# Patient Record
Sex: Female | Born: 1975 | Race: Black or African American | Hispanic: No | Smoking: Never smoker
Health system: Southern US, Community
[De-identification: ages and names within clinical notes are randomized; demographics above are authoritative.]

## PROBLEM LIST (undated history)

## (undated) ENCOUNTER — Inpatient Hospital Stay (HOSPITAL_COMMUNITY): Payer: Self-pay

## (undated) DIAGNOSIS — F32A Depression, unspecified: Secondary | ICD-10-CM

## (undated) DIAGNOSIS — I10 Essential (primary) hypertension: Secondary | ICD-10-CM

## (undated) DIAGNOSIS — R112 Nausea with vomiting, unspecified: Secondary | ICD-10-CM

## (undated) DIAGNOSIS — Z8619 Personal history of other infectious and parasitic diseases: Secondary | ICD-10-CM

## (undated) DIAGNOSIS — Z98891 History of uterine scar from previous surgery: Secondary | ICD-10-CM

## (undated) DIAGNOSIS — R87629 Unspecified abnormal cytological findings in specimens from vagina: Secondary | ICD-10-CM

## (undated) DIAGNOSIS — R51 Headache: Secondary | ICD-10-CM

## (undated) DIAGNOSIS — D219 Benign neoplasm of connective and other soft tissue, unspecified: Secondary | ICD-10-CM

## (undated) DIAGNOSIS — F419 Anxiety disorder, unspecified: Secondary | ICD-10-CM

## (undated) DIAGNOSIS — F329 Major depressive disorder, single episode, unspecified: Secondary | ICD-10-CM

## (undated) DIAGNOSIS — Z9889 Other specified postprocedural states: Secondary | ICD-10-CM

## (undated) HISTORY — DX: Personal history of other infectious and parasitic diseases: Z86.19

## (undated) HISTORY — PX: DILATION AND CURETTAGE OF UTERUS: SHX78

## (undated) HISTORY — PX: INDUCED ABORTION: SHX677

---
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
## 2005-05-14 HISTORY — PX: WISDOM TOOTH EXTRACTION: SHX21

## 2013-05-08 ENCOUNTER — Encounter (HOSPITAL_COMMUNITY): Payer: Self-pay | Admitting: *Deleted

## 2013-05-08 ENCOUNTER — Inpatient Hospital Stay (HOSPITAL_COMMUNITY)
Admission: AD | Admit: 2013-05-08 | Discharge: 2013-05-08 | Disposition: A | Discharge status: Home / Self Care | Payer: Medicaid Other | Source: Ambulatory Visit | Attending: Obstetrics & Gynecology | Admitting: Obstetrics & Gynecology

## 2013-05-08 DIAGNOSIS — Z8679 Personal history of other diseases of the circulatory system: Secondary | ICD-10-CM

## 2013-05-08 DIAGNOSIS — I10 Essential (primary) hypertension: Secondary | ICD-10-CM | POA: Insufficient documentation

## 2013-05-08 DIAGNOSIS — F3289 Other specified depressive episodes: Secondary | ICD-10-CM | POA: Insufficient documentation

## 2013-05-08 DIAGNOSIS — F329 Major depressive disorder, single episode, unspecified: Secondary | ICD-10-CM | POA: Insufficient documentation

## 2013-05-08 DIAGNOSIS — Z3201 Encounter for pregnancy test, result positive: Secondary | ICD-10-CM

## 2013-05-08 DIAGNOSIS — F988 Other specified behavioral and emotional disorders with onset usually occurring in childhood and adolescence: Secondary | ICD-10-CM | POA: Insufficient documentation

## 2013-05-08 HISTORY — DX: Essential (primary) hypertension: I10

## 2013-05-08 HISTORY — DX: Anxiety disorder, unspecified: F41.9

## 2013-05-08 MED ORDER — LABETALOL HCL 200 MG PO TABS: 200.0000 mg | ORAL_TABLET | Freq: Two times a day (BID) | ORAL | Status: DC

## 2013-05-08 MED ORDER — PRENATAL VITAMINS 28-0.8 MG PO TABS: 1.0000 | ORAL_TABLET | Freq: Every day | ORAL | Status: DC

## 2013-05-08 NOTE — MAU Provider Note (Signed)
Attestation of Attending Supervision of Advanced Practitioner (CNM/NP): Evaluation and management procedures were performed by the Advanced Practitioner under my supervision and collaboration.  I have reviewed the Advanced Practitioner's note and chart, and I agree with the management and plan.  HARRAWAY-SMITH, Baylei Siebels 4:53 PM

## 2013-05-08 NOTE — MAU Note (Signed)
Patient states she did a home pregnancy test at home on 12-23 that was positive. Patient states she is ono medication for hypertension but it is not safe to use with pregnancy. Her MD told her to come her to get new Rx for blood pressure. Denies having any problems.

## 2013-05-08 NOTE — MAU Provider Note (Signed)
Chief Complaint: Possible Pregnancy   None    SUBJECTIVE HPI: Suzanne Andrade is a 37 y.o. [id number] at [redacted]w[redacted]d LMP who presents to maternity admissions reporting she is on medications that are not safe in pregnancy but when she called her primary care doctor, he would not prescribe medications in pregnancy and told her to come to [facility] or see an OB/Gyn as soon as possible.  She take medications for anxiety/depression, HTN, and attention deficit disorder that she is concerned about.  She reports doing well with her anxiety and ADD currently without medication but she took her metoprolol today because she often has a racing heartbeat and her MD started this medication to control this symptom.  She denies abdominal or back pain, vaginal bleeding, vaginal itching/burning, urinary symptoms, h/a, dizziness, n/v, or fever/chills.    One medication, lamivudine, is listed in the pt chart by error.  She was taking lamictal but is currently doing well without this medication.   Past Medical History  Diagnosis Date  . Hypertension   . Anxiety    Past Surgical History  Procedure Laterality Date  . Cesarean section    . Cyst removal hand Right 1992  . Wisdom tooth extraction Bilateral 2007   History   Social History  . Marital Status: Legally Separated    Spouse Name: N/A    Number of Children: N/A  . Years of Education: N/A   Occupational History  . Not on file.   Social History Main Topics  . Smoking status: Never Smoker   . Smokeless tobacco: Never Used  . Alcohol Use: No  . Drug Use: No  . Sexual Activity: Yes    Birth Control/ Protection: None   Other Topics Concern  . Not on file   Social History Narrative  . No narrative on file   No current facility-administered medications on file prior to encounter.   No current outpatient prescriptions on file prior to encounter.   No Known Allergies  ROS: Pertinent items in HPI  OBJECTIVE Blood pressure 122/63, pulse 79,  temperature 99.6 F (37.6 C), temperature source Oral, resp. rate 18, height 5\' 4"  (1.626 m), weight 114.125 kg (251 lb 9.6 oz), last menstrual period 04/02/2013, SpO2 100.00%. Patient Vitals for the past 24 hrs:  BP Temp Temp src Pulse Resp SpO2 Height Weight  05/08/13 1500 122/63 mmHg - - 79 - - - -  05/08/13 1416 138/63 mmHg 99.6 F (37.6 C) Oral 89 18 100 % 5\' 4"  (1.626 m) 114.125 kg (251 lb 9.6 oz)   GENERAL: Well-developed, well-nourished female in no acute distress.  HEENT: Normocephalic HEART: normal rate RESP: normal effort ABDOMEN: Soft, non-tender EXTREMITIES: Nontender, no edema NEURO: Alert and oriented   LAB RESULTS Results for orders placed during the hospital encounter of 05/08/13 (from the past 24 hour(s))  POCT PREGNANCY, URINE     Status: Abnormal   Collection Time    05/08/13  3:00 PM      Result Value Range   Preg Test, Ur POSITIVE (*) NEGATIVE    ASSESSMENT 1. Positive pregnancy test   2. History of high blood pressure     PLAN Consult Dr Erin Fulling Discharge home Stop all current medications, including Metoprolol Start Labetalol 200 mg BID PNV with adequate iron started Message sent to [facility] to begin care in [facility] Return to MAU as needed    Medication List    STOP taking these medications       amphetamine-dextroamphetamine 20 MG  tablet  Commonly known as:  ADDERALL     fluticasone 27.5 MCG/SPRAY nasal spray  Commonly known as:  VERAMYST     lamivudine 100 MG tablet  Commonly known as:  EPIVIR     lisinopril-hydrochlorothiazide 20-25 MG per tablet  Commonly known as:  PRINZIDE,ZESTORETIC     metoprolol tartrate 25 MG tablet  Commonly known as:  LOPRESSOR     multivitamin with minerals Tabs tablet      TAKE these medications       labetalol 200 MG tablet  Commonly known as:  NORMODYNE  Take 1 tablet (200 mg total) by mouth 2 (two) times daily.     Prenatal Vitamins 28-0.8 MG Tabs  Take 1 tablet by mouth daily.          Sharen Counter Certified Nurse-Midwife 05/08/2013  3:12 PM

## 2013-06-15 LAB — OB RESULTS CONSOLE ABO/RH: RH TYPE: NEGATIVE

## 2013-06-15 LAB — OB RESULTS CONSOLE ANTIBODY SCREEN: Antibody Screen: NEGATIVE

## 2013-06-15 LAB — OB RESULTS CONSOLE HEPATITIS B SURFACE ANTIGEN: Hepatitis B Surface Ag: NEGATIVE

## 2013-06-15 LAB — OB RESULTS CONSOLE RUBELLA ANTIBODY, IGM: Rubella: IMMUNE

## 2013-06-15 LAB — OB RESULTS CONSOLE HIV ANTIBODY (ROUTINE TESTING): HIV: NONREACTIVE

## 2013-06-15 LAB — OB RESULTS CONSOLE RPR: RPR: NONREACTIVE

## 2013-06-26 ENCOUNTER — Encounter: Payer: Self-pay | Admitting: Endocrinology

## 2013-06-26 ENCOUNTER — Ambulatory Visit (INDEPENDENT_AMBULATORY_CARE_PROVIDER_SITE_OTHER): Payer: Medicaid Other | Admitting: Endocrinology

## 2013-06-26 ENCOUNTER — Other Ambulatory Visit: Payer: Self-pay | Admitting: Endocrinology

## 2013-06-26 ENCOUNTER — Other Ambulatory Visit: Payer: Self-pay | Admitting: *Deleted

## 2013-06-26 VITALS — BP 114/68 | HR 89 | Temp 98.2°F | Resp 14 | Ht 64.0 in | Wt 269.8 lb

## 2013-06-26 DIAGNOSIS — E039 Hypothyroidism, unspecified: Secondary | ICD-10-CM | POA: Insufficient documentation

## 2013-06-26 DIAGNOSIS — I10 Essential (primary) hypertension: Secondary | ICD-10-CM

## 2013-06-26 DIAGNOSIS — R946 Abnormal results of thyroid function studies: Secondary | ICD-10-CM

## 2013-06-26 DIAGNOSIS — R7989 Other specified abnormal findings of blood chemistry: Secondary | ICD-10-CM

## 2013-06-26 LAB — T4, FREE: Free T4: 1.25 ng/dL (ref 0.80–1.80)

## 2013-06-26 LAB — T3, FREE: T3, Free: 3.5 pg/mL (ref 2.3–4.2)

## 2013-06-26 NOTE — Progress Notes (Signed)
Suzanne Andrade M.D.          [facility]   Suzanne Andrade  Reason for Appointment:  Low TSH, new consultation   History of Present Illness:   Apparently she was told about  3 years ago that she had an abnormal thyroid test, probably done as a routine. She only knows that her TSH was low but no records are available and no treatment was required Since she is now pregnant she had a screening TSH done and this was found to be low Currently she only has some complaints of palpitations occasionally which is not new; however does not complain of any unexpected weight loss, heat intolerance, shakiness or nervousness. She does occasionally have trouble swallowing and sometimes needs to drink water to help swallow  She is now referred here for further management     No visits with results within 1 Week(s) from this visit. Latest known visit with results is:  Admission on 05/08/2013, Discharged on 05/08/2013  Component Date Value Ref Range Status  . Preg Test, Ur 05/08/2013 POSITIVE* NEGATIVE Final   Comment:                                 THE SENSITIVITY OF THIS                          METHODOLOGY IS >24 mIU/mL      Medication List       This list is accurate as of: 06/26/13  4:30 PM.  Always use your most recent med list.               labetalol 200 MG tablet  Commonly known as:  NORMODYNE  Take 1 tablet (200 mg total) by mouth 2 (two) times daily.     Prenatal Vitamins 28-0.8 MG Tabs  Take 1 tablet by mouth daily.            Past Medical History  Diagnosis Date  . Hypertension   . Anxiety     Past Surgical History  Procedure Laterality Date  . Cesarean section    . Cyst removal hand Right 1992  . Wisdom tooth extraction Bilateral 2007    Family History  Problem Relation Age of Onset  . Mental illness Mother   . Hypertension Mother   . GER disease  Mother   . Hypertension Father   . Diabetes Father   . Mental illness Maternal Aunt   . Mental illness Maternal Uncle   . Diabetes Maternal Grandmother     Social History:  reports that she has never smoked. She has never used smokeless tobacco. She reports that she does not drink alcohol or use illicit drugs.  Allergies: No Known Allergies  Review of Systems:  She has had a history of weight gain over the last 5 years; she gained about 60 pounds over the last year or so  She does complain of feeling tired but this has been going on for 3 years; also has had symptoms of sleepiness, difficulty with memory Was on Adderal for ADHD because of difficulties in concentration; stopped when she was diagnosed to have pregnancy  CARDIOLOGY:  she has a history of high blood pressure and was previously given Zestoretic.She is currently on labetalol    Also previously has had history of palpitations since 2010 treated with metoprolol. Was not told to have any  specific cardiac problem. May have some more symptoms now since she was switched to labetalol   RESPIRATORY:  no dyspnea on exertion.      GASTROENTEROLOGY:  no Change in bowel habits.      ENDOCRINOLOGY:  no history of Diabetes.             Examination:   BP 114/68  Pulse 89  Temp(Src) 98.2 F (36.8 C)  Resp 14  Ht 5\' 4"  (1.626 m)  Wt 269 lb 12.8 oz (122.38 kg)  BMI 46.29 kg/m2  SpO2 99%  LMP 04/02/2013   General Appearance:  well-built and nourished, pleasant, not anxious or hyperkinetic..        Eyes: No excessive prominence, lid lag or stare. No swelling of the eyelids  Neck: The thyroid is minimally enlarged on the right, normal, smooth, non-tender. There is no lymphadenopathy .          Heart: normal S1 and S2, no murmurs .         Lungs: breath sounds are clear bilaterally  Extremities: hands are warm. No ankle edema. Neurological: REFLEXES: at ankles are normal.  no tremor present   Assessment/Plan:  Abnormally low  TSH, not clear this is subclinical hyperthyroidism or an autonomous thyroid She does have nonspecific history of palpitations which is long-standing and probably unrelated to her thyroid On exam she appears euthyroid and does not have significant goiter  She will have free T4 and free T3 level drawn today to assess actual thyroid levels Discussed with the patient that unless she is overtly hyperthyroid she will not need any antithyroid drugs Most likely she does not have hyperthyroidism related to hyperemesis since she has only mild symptoms of nausea    Hypertension: Well controlled  Suzanne Andrade 06/26/2013, 4:30 PM   Addendum: Labs are normal as follows, no treatment needed at this time, followup in 3 months  Orders Only on 06/26/2013  Component Date Value Ref Range Status  . Free T4 06/26/2013 1.25  0.80 - 1.80 ng/dL Final  . T3, Free 06/26/2013 3.5  2.3 - 4.2 pg/mL Final

## 2013-06-27 NOTE — Progress Notes (Signed)
Quick Note:  Please let patient know that the lab result is normal and no further action needed, followup as scheduled ______

## 2013-06-28 DIAGNOSIS — R7989 Other specified abnormal findings of blood chemistry: Secondary | ICD-10-CM | POA: Insufficient documentation

## 2013-06-28 DIAGNOSIS — I10 Essential (primary) hypertension: Secondary | ICD-10-CM | POA: Insufficient documentation

## 2013-11-30 ENCOUNTER — Other Ambulatory Visit (HOSPITAL_COMMUNITY): Payer: Medicaid Other

## 2013-11-30 ENCOUNTER — Inpatient Hospital Stay (HOSPITAL_COMMUNITY)
Admission: AD | Admit: 2013-11-30 | Discharge: 2013-11-30 | Disposition: A | Discharge status: Home / Self Care | Payer: Medicaid Other | Source: Ambulatory Visit | Attending: Obstetrics and Gynecology | Admitting: Obstetrics and Gynecology

## 2013-11-30 ENCOUNTER — Other Ambulatory Visit (HOSPITAL_COMMUNITY): Payer: Self-pay | Admitting: Obstetrics and Gynecology

## 2013-11-30 ENCOUNTER — Encounter (HOSPITAL_COMMUNITY): Payer: Self-pay | Admitting: *Deleted

## 2013-11-30 ENCOUNTER — Inpatient Hospital Stay (HOSPITAL_COMMUNITY): Payer: Medicaid Other

## 2013-11-30 DIAGNOSIS — O368190 Decreased fetal movements, unspecified trimester, not applicable or unspecified: Secondary | ICD-10-CM | POA: Diagnosis not present

## 2013-11-30 DIAGNOSIS — O10019 Pre-existing essential hypertension complicating pregnancy, unspecified trimester: Secondary | ICD-10-CM | POA: Diagnosis not present

## 2013-11-30 DIAGNOSIS — IMO0001 Reserved for inherently not codable concepts without codable children: Secondary | ICD-10-CM | POA: Insufficient documentation

## 2013-11-30 DIAGNOSIS — O368390 Maternal care for abnormalities of the fetal heart rate or rhythm, unspecified trimester, not applicable or unspecified: Secondary | ICD-10-CM | POA: Insufficient documentation

## 2013-11-30 DIAGNOSIS — F341 Dysthymic disorder: Secondary | ICD-10-CM | POA: Insufficient documentation

## 2013-11-30 DIAGNOSIS — O289 Unspecified abnormal findings on antenatal screening of mother: Secondary | ICD-10-CM

## 2013-11-30 DIAGNOSIS — O288 Other abnormal findings on antenatal screening of mother: Secondary | ICD-10-CM

## 2013-11-30 DIAGNOSIS — O99340 Other mental disorders complicating pregnancy, unspecified trimester: Secondary | ICD-10-CM | POA: Diagnosis not present

## 2013-11-30 DIAGNOSIS — O10919 Unspecified pre-existing hypertension complicating pregnancy, unspecified trimester: Secondary | ICD-10-CM

## 2013-11-30 HISTORY — DX: Unspecified abnormal cytological findings in specimens from vagina: R87.629

## 2013-11-30 HISTORY — DX: Benign neoplasm of connective and other soft tissue, unspecified: D21.9

## 2013-11-30 HISTORY — DX: Depression, unspecified: F32.A

## 2013-11-30 HISTORY — DX: Major depressive disorder, single episode, unspecified: F32.9

## 2013-11-30 HISTORY — DX: Headache: R51

## 2013-11-30 NOTE — MAU Provider Note (Signed)
Chief Complaint:  Decreased Fetal Movement   First Provider Initiated Contact with Patient 11/30/13 1322     HPI: Suzanne Andrade is a 38 y.o. [id number] at [redacted]w[redacted]d who presents to maternity admissions for BPP after NRNST in office. Decreased FM.  Denies contractions, leakage of fluid or vaginal bleeding.   Pregnancy Course: In antenatal testing for 2 vessel cord.  Past Medical History: Past Medical History  Diagnosis Date  . Hypertension   . Anxiety   . Headache(784.0)   . Depression   . Fibroid   . Vaginal Pap smear, abnormal     ok since    Past obstetric history: OB History  Gravida Para Term Preterm AB SAB TAB Ectopic Multiple Living  6 4 4  1  1   4     # Outcome Date GA Lbr Len/2nd Weight Sex Delivery Anes PTL Lv  6 CUR           5 TAB 2008          4 TRM 03/07/00    F LTCS EPI  Y     Comments: previa  3 TRM 07/15/97    F LTCS EPI  Y     Comments: breech, low AFI  2 TRM 01/03/95    M SVD None  Y  1 TRM 11/19/92    F SVD None  Y      Past Surgical History: Past Surgical History  Procedure Laterality Date  . Cesarean section    . Cyst removal hand Right 1992  . Wisdom tooth extraction Bilateral 2007  . Induced abortion       Family History: Family History  Problem Relation Age of Onset  . Mental illness Mother   . Hypertension Mother   . GER disease Mother   . Hypertension Father   . Diabetes Father   . Mental illness Maternal Aunt   . Mental illness Maternal Uncle   . Diabetes Maternal Grandmother     Social History: History  Substance Use Topics  . Smoking status: Never Smoker   . Smokeless tobacco: Never Used  . Alcohol Use: No    Allergies: No Known Allergies  Meds:  Prescriptions prior to admission  Medication Sig Dispense Refill  . ferrous sulfate 325 (65 FE) MG tablet Take 325 mg by mouth daily with breakfast.      . labetalol (NORMODYNE) 200 MG tablet Take 1 tablet (200 mg total) by mouth 2 (two) times daily.  60 tablet  2  . Prenatal  Vit-Fe Fumarate-FA (PRENATAL VITAMINS) 28-0.8 MG TABS Take 1 tablet by mouth daily.  30 tablet  5    ROS: Pertinent findings in history of present illness.  Physical Exam  Blood pressure 104/84, pulse 82, temperature 99.1 F (37.3 C), temperature source Oral, resp. rate 20, last menstrual period 04/02/2013. GENERAL: Well-developed, well-nourished female in no acute distress.  HEENT: normocephalic HEART: normal rate RESP: normal effort ABDOMEN: Soft, non-tender, gravid appropriate for gestational age EXTREMITIES: Nontender, no edema NEURO: alert and oriented SPECULUM EXAM: Deferred    FHT:  Baseline 140 , moderate variability, 10x10 accelerations present, no decelerations Contractions: UI   Labs: No results found for this or any previous visit (from the past 24 hour(s)).  Imaging:  BPP 8/8  MAU Course:   Assessment: 1. Non-reactive NST (non-stress test)   2. Two vessel umbilical cord, antepartum, fetus 1   3. Chronic hypertension during pregnancy, antepartum     Plan: Discharge home Labor precautions  and fetal kick counts     Follow-up Information   Follow up with [facility] On 12/03/2013. (As scheduled or , As needed if symptoms worsen)    Contact information:   [address]  [address] [place] [address] [phone]       Follow up with [facility]. (As needed in emergencies)    Contact information:   [address] [id number] [place] [address] [phone]       Medication List         ferrous sulfate 325 (65 FE) MG tablet  Take 325 mg by mouth daily with breakfast.     labetalol 200 MG tablet  Commonly known as:  NORMODYNE  Take 1 tablet (200 mg total) by mouth 2 (two) times daily.     Prenatal Vitamins 28-0.8 MG Tabs  Take 1 tablet by mouth daily.        [place], [place] 11/30/2013 2:03 PM

## 2013-11-30 NOTE — MAU Note (Signed)
Monitoring in office due to 2 vessel cord, some movement, but was not reactive.  No [place] available- sent for BPP.

## 2013-11-30 NOTE — Discharge Instructions (Signed)
Nonstress Test The nonstress test is a procedure that monitors the fetus's heartbeat. The test will monitor the heartbeat when the fetus is at rest and while the fetus is moving. In a healthy fetus, there will be an increase in fetal heart rate when the fetus moves or kicks. The heart rate will decrease at rest. This test helps determine if the fetus is healthy. The test may take up to a half hour. Your caregiver will look at a number of patterns in the heart rate tracing to make sure your baby is thriving. If there is concern, your caregiver may order additional tests or may suggest another course of action. This test is often done in the third trimester and can help determine if an early delivery is needed and safe. Common reasons to have this test are:  You are past your due date.  You have a high-risk pregnancy.  You are feeling less movement than normal.  You have lost a pregnancy in the past.  Your caregiver suspects fetal growth problems.  You have too much or too little amniotic fluid. BEFORE THE PROCEDURE  Eat a meal right before the test or as directed by your caregiver. Food may help stimulate fetal movements.  Use the restroom right before the test. PROCEDURE  Two belts will be placed around your abdomen. These belts have monitors attached to them. One records the fetal heart rate and the other records uterine contractions.  You may be asked to lie down on your side or to stay sitting upright.  You may be given a button to press when you feel movement.  The fetal heartbeat is listened to and watched on a screen. The heartbeat is recorded on a sheet of paper.  If the fetus seems to be sleeping, you may be asked to drink some juice or soda, gently press your abdomen, or make some noise to wake the fetus. AFTER THE PROCEDURE  Your caregiver will discuss the test results with you and make recommendations for the near future. Document Released: 04/20/2002 Document Revised:  08/25/2012 Document Reviewed: 06/03/2012 [facility] Patient Information 2015 [place], [place]. This information is not intended to replace advice given to you by your health care provider. Make sure you discuss any questions you have with your health care provider.  Fetal Biophysical Profile This is a test that measures five different variables of the fetus: Heart rate, breathing movement, total movement of the baby, fetal muscle tone, the amount of amniotic fluid, and the heart rate activity of the fetus. The five variables are measured individually and contribute either a 2 or a 0 to the overall scoring of the test. The measurements are as follows:  Fetal heart rate activity. This is measured and scored in the same way as a non-stress test. The fetal heart rate is considered reactive when there are movement-associated fetal heart rate increases of at least 15 beats per minute above baseline, and 15 seconds in duration over a 20-minute period. A score of 2 is given for reactivity, and a score of 0 indicates that the fetal heart rate is non-reactive.  Fetal breathing movements. This is scored based on fetal breathing movements and indicate fetal well-being. Their absence may indicate a low oxygen level for the fetus. Fetal breathing increases in frequency and uniformity after the 36th week of pregnancy. To earn a score of 2, the fetus must have at least one episode of fetal breathing lasting at least 60 seconds within a 30-minute observation. Absence of  this breathing is scored a 0 on the BPP.  Fetal body movements. Fetal activity is a reflection of brain integrity and function. The presence of at least three episodes of fetal movements within a 30-minute period is given a score of 2. A score of 0 is given with two or less movements in this time period. Fetal activity is highest 1 to 3 hours after the mother has eaten a meal.  Fetal tone. In the uterus, the fetus is normally in a position of flexion. This  means the head is bent down towards the knees. The fetus also stretches, rolls, and moves in the uterus. The arms, legs, trunk, and head may be flexed and extended. A score of 2 is earned when there is at least one episode of active extension with return flexion. A score of 0 is given for slow extension with a return to only partial flexion. Fetal movement not followed by return to flexion, limbs or spine in extension, and an open fetal hand score 0.  Amniotic fluid volume. Amniotic fluid volume has been demonstrated to be a good method of predicting fetal distress. Too little amniotic fluid has been associated with fetal abnormalities, slow uterine growth, and over due pregnancy. A score of 2 is given for this when there is at least one pocket of amniotic fluid that measures 1 cm in a specific area. A score of 0 indicates either that fluid is absent in most areas of the uterine cavity or that the largest pocket of fluid measures less than 1 cm. PREPARATION FOR TEST No preparation or fasting is necessary. NORMAL FINDINGS  A score of 8-10 points (if amniotic fluid volume is adequate).  Possible critical values: Less than 4 may necessitate immediate delivery of fetus. Ranges for normal findings may vary among different laboratories and hospitals. You should always check with your doctor after having lab work or other tests done to discuss the meaning of your test results and whether your values are considered within normal limits. MEANING OF TEST  Your caregiver will go over the test results with you and discuss the importance and meaning of your results, as well as treatment options and the need for additional tests if necessary. OBTAINING THE TEST RESULTS  It is your responsibility to obtain your test results. Ask the lab or department performing the test when and how you will get your results. Document Released: 08/31/2004 Document Revised: 07/23/2011 Document Reviewed: 04/09/2008 [facility] Patient  Information 2015 [place], [place]. This information is not intended to replace advice given to you by your health care provider. Make sure you discuss any questions you have with your health care provider.

## 2014-01-01 ENCOUNTER — Encounter (HOSPITAL_COMMUNITY): Payer: Self-pay

## 2014-01-04 ENCOUNTER — Encounter (HOSPITAL_COMMUNITY): Payer: Self-pay

## 2014-01-04 ENCOUNTER — Encounter (HOSPITAL_COMMUNITY)
Admission: RE | Admit: 2014-01-04 | Discharge: 2014-01-04 | Disposition: A | Discharge status: Home / Self Care | Payer: Medicaid Other | Source: Ambulatory Visit | Attending: Obstetrics and Gynecology | Admitting: Obstetrics and Gynecology

## 2014-01-04 LAB — CBC
HCT: 34.9 % — ABNORMAL LOW (ref 36.0–46.0)
HEMOGLOBIN: 11.8 g/dL — AB (ref 12.0–15.0)
MCH: 25.7 pg — ABNORMAL LOW (ref 26.0–34.0)
MCHC: 33.8 g/dL (ref 30.0–36.0)
MCV: 76 fL — ABNORMAL LOW (ref 78.0–100.0)
Platelets: 197 10*3/uL (ref 150–400)
RBC: 4.59 MIL/uL (ref 3.87–5.11)
RDW: 15.2 % (ref 11.5–15.5)
WBC: 10.3 10*3/uL (ref 4.0–10.5)

## 2014-01-04 LAB — RPR

## 2014-01-04 MED ORDER — DEXTROSE 5 % IV SOLN
3.0000 g | INTRAVENOUS | Status: AC
  Administered 2014-01-05: 3 g via INTRAVENOUS
  Filled 2014-01-04: qty 3000

## 2014-01-04 NOTE — Patient Instructions (Signed)
[place]  01/04/2014   Your procedure is scheduled on:  01/05/14  Enter through the Main Entrance of [facility] at [place] up the phone at the desk and dial [phone].   Call this number if you have problems the morning of surgery: [phone]   Remember:   Do not eat food:After Midnight.  Do not drink clear liquids: After Midnight.  Take these medicines the morning of surgery with A SIP OF WATER: Blood pressure medication   Do not wear jewelry, make-up or nail polish.  Do not wear lotions, powders, or perfumes. You may wear deodorant.  Do not shave 48 hours prior to surgery.  Do not bring valuables to the hospital.  [facility] is not   responsible for any belongings or valuables brought to the hospital.  Contacts, dentures or bridgework may not be worn into surgery.  Leave suitcase in the car. After surgery it may be brought to your room.  For patients admitted to the hospital, checkout time is 11:00 AM the day of              discharge.   Patients discharged the day of surgery will not be allowed to drive             home.  Name and phone number of your driver: NA  Special Instructions:      Please read over the following fact sheets that you were given:   Surgical Site Infection Prevention

## 2014-01-04 NOTE — H&P (Signed)
Suzanne Andrade is a 38 y.o. female [phone] at 39wk with HTN with h/o 2 LTCS.  D/W pt r/b/a of rLTCS - including r/b/a.  Pt voices understanding.  +FM, no LOF, no VB, occ ctx.  BP reasonably controlled with labetalol 200mg  bid.  Fetus with SUA, followed with BW NST and growth scans.  Also unable to obtain enough cells for panorama.  Nl AFP.  Pt is morbidly obese, has h/o hypothyroid - sent to endocrine for decreased TSH in first trimester.    Maternal Medical History:  Contractions: Frequency: irregular.    Fetal activity: Perceived fetal activity is normal.    Prenatal complications: PIH.   Prenatal Complications - Diabetes: none.    OB History   Grav Para Term Preterm Abortions TAB SAB Ect Mult Living   6 4 4  1 1    4     G1 SVD female 7#7,SVD G2 FAVD, female, 8#4 G60 LTCS 6#14 female G35 LTCS 7#8 female G5 TAB G6 present  H/o abn pap, colpo, nl since H/o trich  Past Medical History  Diagnosis Date  . Hypertension   . Anxiety   . Headache(784.0)   . Depression   . Fibroid   . Vaginal Pap smear, abnormal     ok since  ?hypothyroid, allergies, tachy/palp - nl echo, holter  Past Surgical History  Procedure Laterality Date  . Cesarean section    . Cyst removal hand Right 1992  . Wisdom tooth extraction Bilateral 2007  . Induced abortion    . Dilation and curettage of uterus     Family History: family history includes Diabetes in her father and maternal grandmother; GER disease in her mother; Hypertension in her father and mother; Mental illness in her maternal aunt, maternal uncle, and mother. Social History:  reports that she has never smoked. She has never used smokeless tobacco. She reports that she does not drink alcohol or use illicit drugs.in relationship Meds Labetalol, PNV All NKDA   Prenatal Transfer Tool  Maternal Diabetes: No Genetic Screening: Normal Maternal Ultrasounds/Referrals: Abnormal:  Findings:   Other:Single Umbilical Artery Fetal Ultrasounds or  other Referrals:  None Maternal Substance Abuse:  No Significant Maternal Medications:  Meds include: Other: Labetalol 200mg  bid Significant Maternal Lab Results:  Lab values include: Group B Strep positive Other Comments:  None  Review of Systems  Constitutional: Negative.   HENT: Negative.   Eyes: Negative.   Respiratory: Negative.   Cardiovascular: Negative.   Gastrointestinal: Negative.   Genitourinary: Negative.   Musculoskeletal: Positive for back pain.  Skin: Negative.   Neurological: Negative.   Psychiatric/Behavioral: Negative.       Last menstrual period 04/02/2013. Maternal Exam:  Abdomen: Surgical scars: low transverse.   Fundal height is appropriate for gestation, limited by obesity.   Estimated fetal weight is 7-8.5#.   Fetal presentation: vertex  Introitus: Normal vulva. Normal vagina.  Pelvis: questionable for delivery.      Physical Exam  Constitutional: She is oriented to person, place, and time.  obese  HENT:  Head: Normocephalic and atraumatic.  Cardiovascular: Normal rate and regular rhythm.   Respiratory: Effort normal and breath sounds normal. No respiratory distress. She has no wheezes.  GI: Soft. Bowel sounds are normal. She exhibits no distension. There is no tenderness.  Musculoskeletal: Normal range of motion.  Neurological: She is alert and oriented to person, place, and time.  Skin: Skin is warm and dry.  Psychiatric: She has a normal mood and affect. Her behavior  is normal.    Prenatal labs: ABO, Rh: --/--/O NEG (08/24 1119) Antibody: POS (08/24 1119) Rubella: Immune (02/02 1130) RPR: NON REAC (08/24 1119)  HBsAg: Negative (02/02 1130)  HIV: Non-reactive (02/02 1130)  GBS:   positive  Hgb 11.0/Ur Cx neg/ GC neg/ Chl neg/ Pap WNL, HR HPV positive/ CF neg/AFP WNL/ glucola 80 TSH .141 - refer to endocrine/  [place] good growth, female,nl anat - SUA - 2VC, cwd First Tri [place] cwd Good growth, nl BW NST  Assessment/Plan: 38yo [id number] at 38  for rLTCS Ancef for prophylaxis D/w pt r/b/a of rLTCS   Bovard-Stuckert, Suzanne Andrade 01/04/2014, 9:00 PM

## 2014-01-05 ENCOUNTER — Inpatient Hospital Stay (HOSPITAL_COMMUNITY): Payer: Medicaid Other | Admitting: Anesthesiology

## 2014-01-05 ENCOUNTER — Inpatient Hospital Stay (HOSPITAL_COMMUNITY)
Admission: AD | Admit: 2014-01-05 | Discharge: 2014-01-07 | DRG: 765 | Disposition: A | Discharge status: Home / Self Care | Payer: Medicaid Other | Source: Ambulatory Visit | Attending: Obstetrics and Gynecology | Admitting: Obstetrics and Gynecology

## 2014-01-05 ENCOUNTER — Encounter (HOSPITAL_COMMUNITY)
Admission: AD | Disposition: A | Discharge status: Home / Self Care | Payer: Self-pay | Source: Ambulatory Visit | Attending: Obstetrics and Gynecology

## 2014-01-05 ENCOUNTER — Encounter (HOSPITAL_COMMUNITY): Payer: Self-pay | Admitting: *Deleted

## 2014-01-05 ENCOUNTER — Encounter (HOSPITAL_COMMUNITY): Payer: Medicaid Other | Admitting: Anesthesiology

## 2014-01-05 DIAGNOSIS — F341 Dysthymic disorder: Secondary | ICD-10-CM | POA: Diagnosis present

## 2014-01-05 DIAGNOSIS — Z6841 Body Mass Index (BMI) 40.0 and over, adult: Secondary | ICD-10-CM | POA: Diagnosis not present

## 2014-01-05 DIAGNOSIS — O1002 Pre-existing essential hypertension complicating childbirth: Secondary | ICD-10-CM | POA: Diagnosis present

## 2014-01-05 DIAGNOSIS — Z22330 Carrier of Group B streptococcus: Secondary | ICD-10-CM | POA: Diagnosis not present

## 2014-01-05 DIAGNOSIS — O99284 Endocrine, nutritional and metabolic diseases complicating childbirth: Secondary | ICD-10-CM

## 2014-01-05 DIAGNOSIS — E039 Hypothyroidism, unspecified: Secondary | ICD-10-CM | POA: Diagnosis present

## 2014-01-05 DIAGNOSIS — O99892 Other specified diseases and conditions complicating childbirth: Secondary | ICD-10-CM | POA: Diagnosis present

## 2014-01-05 DIAGNOSIS — E079 Disorder of thyroid, unspecified: Secondary | ICD-10-CM | POA: Diagnosis present

## 2014-01-05 DIAGNOSIS — E669 Obesity, unspecified: Secondary | ICD-10-CM | POA: Diagnosis present

## 2014-01-05 DIAGNOSIS — O09529 Supervision of elderly multigravida, unspecified trimester: Secondary | ICD-10-CM | POA: Diagnosis present

## 2014-01-05 DIAGNOSIS — O99344 Other mental disorders complicating childbirth: Secondary | ICD-10-CM | POA: Diagnosis present

## 2014-01-05 DIAGNOSIS — O34219 Maternal care for unspecified type scar from previous cesarean delivery: Principal | ICD-10-CM | POA: Diagnosis present

## 2014-01-05 DIAGNOSIS — O99214 Obesity complicating childbirth: Secondary | ICD-10-CM

## 2014-01-05 DIAGNOSIS — Z98891 History of uterine scar from previous surgery: Secondary | ICD-10-CM

## 2014-01-05 DIAGNOSIS — O9989 Other specified diseases and conditions complicating pregnancy, childbirth and the puerperium: Secondary | ICD-10-CM

## 2014-01-05 HISTORY — DX: History of uterine scar from previous surgery: Z98.891

## 2014-01-05 SURGERY — Surgical Case
Anesthesia: Epidural | Site: Abdomen

## 2014-01-05 MED ORDER — ONDANSETRON HCL 4 MG/2ML IJ SOLN
INTRAMUSCULAR | Status: DC | PRN
  Administered 2014-01-05: 4 mg via INTRAVENOUS

## 2014-01-05 MED ORDER — ONDANSETRON HCL 4 MG/2ML IJ SOLN: 4.0000 mg | Freq: Three times a day (TID) | INTRAMUSCULAR | Status: DC | PRN

## 2014-01-05 MED ORDER — OXYTOCIN 10 UNIT/ML IJ SOLN
40.0000 [IU] | INTRAVENOUS | Status: DC | PRN
  Administered 2014-01-05: 40 [IU] via INTRAVENOUS

## 2014-01-05 MED ORDER — PROMETHAZINE HCL 25 MG/ML IJ SOLN
6.2500 mg | INTRAMUSCULAR | Status: DC | PRN
  Administered 2014-01-05: 6.25 mg via INTRAVENOUS

## 2014-01-05 MED ORDER — SIMETHICONE 80 MG PO CHEW
80.0000 mg | CHEWABLE_TABLET | Freq: Three times a day (TID) | ORAL | Status: DC
  Administered 2014-01-05 – 2014-01-07 (×6): 80 mg via ORAL
  Filled 2014-01-05 (×5): qty 1

## 2014-01-05 MED ORDER — ONDANSETRON HCL 4 MG/2ML IJ SOLN
INTRAMUSCULAR | Status: AC
  Filled 2014-01-05: qty 2

## 2014-01-05 MED ORDER — SIMETHICONE 80 MG PO CHEW
80.0000 mg | CHEWABLE_TABLET | ORAL | Status: DC
  Administered 2014-01-05: 80 mg via ORAL
  Filled 2014-01-05 (×2): qty 1

## 2014-01-05 MED ORDER — BUPIVACAINE IN DEXTROSE 0.75-8.25 % IT SOLN
INTRATHECAL | Status: DC | PRN
  Administered 2014-01-05: 1.4 mL via INTRATHECAL

## 2014-01-05 MED ORDER — FENTANYL CITRATE 0.05 MG/ML IJ SOLN
INTRAMUSCULAR | Status: AC
  Filled 2014-01-05: qty 2

## 2014-01-05 MED ORDER — LACTATED RINGERS IV SOLN
Freq: Once | INTRAVENOUS | Status: AC
  Administered 2014-01-05: 07:00:00 via INTRAVENOUS

## 2014-01-05 MED ORDER — IBUPROFEN 600 MG PO TABS: 600.0000 mg | ORAL_TABLET | Freq: Four times a day (QID) | ORAL | Status: DC | PRN

## 2014-01-05 MED ORDER — FENTANYL CITRATE 0.05 MG/ML IJ SOLN
INTRAMUSCULAR | Status: DC | PRN
  Administered 2014-01-05: 15 ug via INTRATHECAL

## 2014-01-05 MED ORDER — NALOXONE HCL 0.4 MG/ML IJ SOLN: 0.4000 mg | INTRAMUSCULAR | Status: DC | PRN

## 2014-01-05 MED ORDER — PROMETHAZINE HCL 25 MG/ML IJ SOLN
INTRAMUSCULAR | Status: AC
  Filled 2014-01-05: qty 1

## 2014-01-05 MED ORDER — HYDROMORPHONE HCL PF 1 MG/ML IJ SOLN
0.2500 mg | INTRAMUSCULAR | Status: DC | PRN
  Administered 2014-01-05: 0.25 mg via INTRAVENOUS

## 2014-01-05 MED ORDER — SIMETHICONE 80 MG PO CHEW
80.0000 mg | CHEWABLE_TABLET | ORAL | Status: DC | PRN
  Administered 2014-01-06: 80 mg via ORAL
  Filled 2014-01-05: qty 1

## 2014-01-05 MED ORDER — WITCH HAZEL-GLYCERIN EX PADS: 1.0000 "application " | MEDICATED_PAD | CUTANEOUS | Status: DC | PRN

## 2014-01-05 MED ORDER — MORPHINE SULFATE 0.5 MG/ML IJ SOLN
INTRAMUSCULAR | Status: AC
  Filled 2014-01-05: qty 10

## 2014-01-05 MED ORDER — OXYTOCIN 40 UNITS IN LACTATED RINGERS INFUSION - SIMPLE MED: 62.5000 mL/h | INTRAVENOUS | Status: AC

## 2014-01-05 MED ORDER — OXYTOCIN 10 UNIT/ML IJ SOLN
INTRAMUSCULAR | Status: AC
  Filled 2014-01-05: qty 4

## 2014-01-05 MED ORDER — MENTHOL 3 MG MT LOZG: 1.0000 | LOZENGE | OROMUCOSAL | Status: DC | PRN

## 2014-01-05 MED ORDER — DIPHENHYDRAMINE HCL 50 MG/ML IJ SOLN: 25.0000 mg | INTRAMUSCULAR | Status: DC | PRN

## 2014-01-05 MED ORDER — OXYCODONE-ACETAMINOPHEN 5-325 MG PO TABS
1.0000 | ORAL_TABLET | ORAL | Status: DC | PRN
  Administered 2014-01-06 – 2014-01-07 (×5): 1 via ORAL
  Filled 2014-01-05 (×5): qty 1

## 2014-01-05 MED ORDER — DIPHENHYDRAMINE HCL 25 MG PO CAPS
25.0000 mg | ORAL_CAPSULE | ORAL | Status: DC | PRN
Start: 2014-01-05 — End: 2014-01-07
  Filled 2014-01-05: qty 1

## 2014-01-05 MED ORDER — KETOROLAC TROMETHAMINE 30 MG/ML IJ SOLN: 15.0000 mg | Freq: Once | INTRAMUSCULAR | Status: DC | PRN

## 2014-01-05 MED ORDER — SODIUM CHLORIDE 0.9 % IJ SOLN: 3.0000 mL | INTRAMUSCULAR | Status: DC | PRN

## 2014-01-05 MED ORDER — HYDROMORPHONE HCL PF 1 MG/ML IJ SOLN
INTRAMUSCULAR | Status: AC
  Filled 2014-01-05: qty 1

## 2014-01-05 MED ORDER — ZOLPIDEM TARTRATE 5 MG PO TABS
5.0000 mg | ORAL_TABLET | Freq: Every evening | ORAL | Status: DC | PRN
Start: 2014-01-05 — End: 2014-01-07

## 2014-01-05 MED ORDER — MEPERIDINE HCL 25 MG/ML IJ SOLN: 6.2500 mg | INTRAMUSCULAR | Status: DC | PRN

## 2014-01-05 MED ORDER — SENNOSIDES-DOCUSATE SODIUM 8.6-50 MG PO TABS
2.0000 | ORAL_TABLET | ORAL | Status: DC
  Administered 2014-01-05 – 2014-01-06 (×2): 2 via ORAL
  Filled 2014-01-05 (×2): qty 2

## 2014-01-05 MED ORDER — DEXAMETHASONE SODIUM PHOSPHATE 10 MG/ML IJ SOLN
INTRAMUSCULAR | Status: DC | PRN
  Administered 2014-01-05: 5 mg via INTRAVENOUS

## 2014-01-05 MED ORDER — PRENATAL MULTIVITAMIN CH
1.0000 | ORAL_TABLET | Freq: Every day | ORAL | Status: DC
  Administered 2014-01-06: 1 via ORAL
  Filled 2014-01-05: qty 1

## 2014-01-05 MED ORDER — LACTATED RINGERS IV SOLN
INTRAVENOUS | Status: DC
  Administered 2014-01-05: 16:00:00 via INTRAVENOUS

## 2014-01-05 MED ORDER — SCOPOLAMINE 1 MG/3DAYS TD PT72
1.0000 | MEDICATED_PATCH | Freq: Once | TRANSDERMAL | Status: DC
  Administered 2014-01-05: 1.5 mg via TRANSDERMAL

## 2014-01-05 MED ORDER — METOCLOPRAMIDE HCL 5 MG/ML IJ SOLN
INTRAMUSCULAR | Status: AC
  Filled 2014-01-05: qty 2

## 2014-01-05 MED ORDER — DIPHENHYDRAMINE HCL 50 MG/ML IJ SOLN: 12.5000 mg | INTRAMUSCULAR | Status: DC | PRN

## 2014-01-05 MED ORDER — PHENYLEPHRINE 8 MG IN D5W 100 ML (0.08MG/ML) PREMIX OPTIME
INJECTION | INTRAVENOUS | Status: DC | PRN
  Administered 2014-01-05: 60 ug/min via INTRAVENOUS

## 2014-01-05 MED ORDER — PHENYLEPHRINE 8 MG IN D5W 100 ML (0.08MG/ML) PREMIX OPTIME
INJECTION | INTRAVENOUS | Status: AC
  Filled 2014-01-05: qty 100

## 2014-01-05 MED ORDER — SCOPOLAMINE 1 MG/3DAYS TD PT72: 1.0000 | MEDICATED_PATCH | Freq: Once | TRANSDERMAL | Status: DC

## 2014-01-05 MED ORDER — NALOXONE HCL 1 MG/ML IJ SOLN
1.0000 ug/kg/h | INTRAMUSCULAR | Status: DC | PRN
  Filled 2014-01-05: qty 2

## 2014-01-05 MED ORDER — NALBUPHINE HCL 10 MG/ML IJ SOLN: 5.0000 mg | INTRAMUSCULAR | Status: DC | PRN

## 2014-01-05 MED ORDER — IBUPROFEN 800 MG PO TABS
800.0000 mg | ORAL_TABLET | Freq: Three times a day (TID) | ORAL | Status: DC
  Administered 2014-01-05 – 2014-01-07 (×6): 800 mg via ORAL
  Filled 2014-01-05 (×6): qty 1

## 2014-01-05 MED ORDER — LABETALOL HCL 200 MG PO TABS
200.0000 mg | ORAL_TABLET | Freq: Two times a day (BID) | ORAL | Status: DC
  Administered 2014-01-05 – 2014-01-07 (×4): 200 mg via ORAL
  Filled 2014-01-05 (×4): qty 1

## 2014-01-05 MED ORDER — DEXAMETHASONE SODIUM PHOSPHATE 10 MG/ML IJ SOLN
INTRAMUSCULAR | Status: AC
  Filled 2014-01-05: qty 1

## 2014-01-05 MED ORDER — LANOLIN HYDROUS EX OINT: 1.0000 "application " | TOPICAL_OINTMENT | CUTANEOUS | Status: DC | PRN

## 2014-01-05 MED ORDER — KETOROLAC TROMETHAMINE 30 MG/ML IJ SOLN
INTRAMUSCULAR | Status: AC
  Filled 2014-01-05: qty 1

## 2014-01-05 MED ORDER — SCOPOLAMINE 1 MG/3DAYS TD PT72
MEDICATED_PATCH | TRANSDERMAL | Status: AC
  Administered 2014-01-05: 1.5 mg via TRANSDERMAL
  Filled 2014-01-05: qty 1

## 2014-01-05 MED ORDER — KETOROLAC TROMETHAMINE 30 MG/ML IJ SOLN
30.0000 mg | Freq: Four times a day (QID) | INTRAMUSCULAR | Status: AC | PRN
  Administered 2014-01-05: 30 mg via INTRAVENOUS

## 2014-01-05 MED ORDER — ONDANSETRON HCL 4 MG/2ML IJ SOLN: 4.0000 mg | INTRAMUSCULAR | Status: DC | PRN

## 2014-01-05 MED ORDER — LACTATED RINGERS IV SOLN
INTRAVENOUS | Status: DC
  Administered 2014-01-05 (×2): via INTRAVENOUS

## 2014-01-05 MED ORDER — METOCLOPRAMIDE HCL 5 MG/ML IJ SOLN
10.0000 mg | Freq: Three times a day (TID) | INTRAMUSCULAR | Status: DC | PRN
  Administered 2014-01-05: 10 mg via INTRAVENOUS

## 2014-01-05 MED ORDER — MORPHINE SULFATE (PF) 0.5 MG/ML IJ SOLN
INTRAMUSCULAR | Status: DC | PRN
  Administered 2014-01-05: .1 mg via INTRATHECAL

## 2014-01-05 MED ORDER — KETOROLAC TROMETHAMINE 30 MG/ML IJ SOLN: 30.0000 mg | Freq: Four times a day (QID) | INTRAMUSCULAR | Status: AC | PRN

## 2014-01-05 MED ORDER — DIPHENHYDRAMINE HCL 25 MG PO CAPS: 25.0000 mg | ORAL_CAPSULE | Freq: Four times a day (QID) | ORAL | Status: DC | PRN

## 2014-01-05 MED ORDER — DIBUCAINE 1 % RE OINT: 1.0000 "application " | TOPICAL_OINTMENT | RECTAL | Status: DC | PRN

## 2014-01-05 MED ORDER — ONDANSETRON HCL 4 MG PO TABS
4.0000 mg | ORAL_TABLET | ORAL | Status: DC | PRN
Start: 2014-01-05 — End: 2014-01-07

## 2014-01-05 SURGICAL SUPPLY — 36 items
BLADE SURG 10 STRL SS (BLADE) ×6 IMPLANT
CLAMP CORD UMBIL (MISCELLANEOUS) IMPLANT
CLOTH BEACON ORANGE TIMEOUT ST (SAFETY) ×3 IMPLANT
CONTAINER PREFILL 10% NBF 15ML (MISCELLANEOUS) IMPLANT
DRAPE LG THREE QUARTER DISP (DRAPES) ×3 IMPLANT
DRSG OPSITE POSTOP 4X10 (GAUZE/BANDAGES/DRESSINGS) ×3 IMPLANT
DURAPREP 26ML APPLICATOR (WOUND CARE) ×3 IMPLANT
ELECT REM PT RETURN 9FT ADLT (ELECTROSURGICAL) ×3
ELECTRODE REM PT RTRN 9FT ADLT (ELECTROSURGICAL) ×1 IMPLANT
EXTRACTOR VACUUM M CUP 4 TUBE (SUCTIONS) IMPLANT
EXTRACTOR VACUUM M CUP 4' TUBE (SUCTIONS)
GLOVE BIO SURGEON STRL SZ 6.5 (GLOVE) ×2 IMPLANT
GLOVE BIO SURGEONS STRL SZ 6.5 (GLOVE) ×1
GOWN STRL REUS W/TWL LRG LVL3 (GOWN DISPOSABLE) ×6 IMPLANT
KIT ABG SYR 3ML LUER SLIP (SYRINGE) IMPLANT
NEEDLE HYPO 25X5/8 SAFETYGLIDE (NEEDLE) IMPLANT
NS IRRIG 1000ML POUR BTL (IV SOLUTION) ×3 IMPLANT
PACK C SECTION WH (CUSTOM PROCEDURE TRAY) ×3 IMPLANT
PAD OB MATERNITY 4.3X12.25 (PERSONAL CARE ITEMS) ×3 IMPLANT
RTRCTR C-SECT PINK 25CM LRG (MISCELLANEOUS) ×3 IMPLANT
STAPLER VISISTAT 35W (STAPLE) IMPLANT
SUT MNCRL 0 VIOLET CTX 36 (SUTURE) ×2 IMPLANT
SUT MONOCRYL 0 CTX 36 (SUTURE) ×4
SUT PLAIN 1 NONE 54 (SUTURE) IMPLANT
SUT PLAIN 2 0 XLH (SUTURE) ×3 IMPLANT
SUT VIC AB 0 CT1 27 (SUTURE) ×4
SUT VIC AB 0 CT1 27XBRD ANBCTR (SUTURE) ×2 IMPLANT
SUT VIC AB 2-0 CT1 27 (SUTURE) ×2
SUT VIC AB 2-0 CT1 TAPERPNT 27 (SUTURE) ×1 IMPLANT
SUT VIC AB 3-0 SH 27 (SUTURE) ×2
SUT VIC AB 3-0 SH 27X BRD (SUTURE) ×1 IMPLANT
SUT VIC AB 4-0 KS 27 (SUTURE) ×3 IMPLANT
SYR BULB IRRIGATION 50ML (SYRINGE) ×3 IMPLANT
TOWEL OR 17X24 6PK STRL BLUE (TOWEL DISPOSABLE) ×3 IMPLANT
TRAY FOLEY CATH 14FR (SET/KITS/TRAYS/PACK) ×3 IMPLANT
WATER STERILE IRR 1000ML POUR (IV SOLUTION) ×3 IMPLANT

## 2014-01-05 NOTE — Op Note (Signed)
Suzanne Andrade, Suzanne Andrade              ACCOUNT NO.:  [id number]  MEDICAL RECORD NO.:  [id number]  LOCATION:  [facility]                          FACILITY:  [facility]  PHYSICIAN:  Thornell Sartorius, MD        DATE OF BIRTH:  1975-06-22  DATE OF PROCEDURE:  01/05/2014 DATE OF DISCHARGE:                              OPERATIVE REPORT   PROCEDURE:  Repeat low-transverse cesarean section.  SURGEON:  Thornell Sartorius, MD  ASSISTANT:  Paula Compton, MD  ANESTHESIA:  Combined spinal epidural.  EBL:  Approximately 600 mL.  URINE OUTPUT:  200 mL.  IV FLUIDS:  2500 mL.  COMPLICATION:  None.  PATHOLOGY:  Placenta to L and D.  DESCRIPTION OF PROCEDURE:  After informed consent was reviewed with the patient and her partner, she was transported to the OR, where combined spinal and epidural anesthesia was placed and found to be adequate.  She was then placed in the supine position with a leftward tilt, prepped and draped in a normal sterile fashion.  As the level of her spinal was appropriate, her skin was incised in a Pfannenstiel fashion at the level of her previous incision and carried through to the underlying layer of fascia sharply.  Fascia was incised in the midline.  The incision was extended laterally with Mayo scissors.  Superior aspect of the fascial incision was grasped with Kocher clamps elevating the rectus muscles were dissected off both bluntly and sharply.  In the process, the midline was noted and entered somewhat bluntly.  There was noted to be omental adhesions at the level of the peritoneum as well as anterior uterine wall adhesions.  These were lysed using Bovie cautery.  An Alexis skin retractor was placed carefully checking to make sure no bowel was entrapped.  The uterus was incised in transverse fashion.  The infant was delivered from a vertex presentation.  Nose and mouth were suctioned on the field.  Cord was clamped and cut.  Infant was handed off to the awaiting pediatric staff.   Placenta was expressed.  Uterus was cleared of all clot and debris.  The uterine incision was closed in 2 layers with 0 Monocryl the first of which was a running locked and the second was an imbricating layer.  This was noted to be hemostatic except for 2 areas that were closed with the remaining 5-0 figure-of-eights, 3- 0 Vicryl, and Bovie cautery.  The adnexa were inspected and found to be within normal limits.  Normal tubes and ovaries bilaterally.  The Alexis retractor was removed.  Peritoneum was reapproximated with the rectus muscles with 2-0 Vicryl in a running fashion.  Subfascial planes were inspected and found to be hemostatic.  The fascia was closed from either angle overlapping in the midline with 0 Vicryl.  The subcuticular adipose tissue was made hemostatic with Bovie cautery.  The dead space was closed with 3-0 plain gut.  The skin was closed with 4-0 Vicryl on a Keith needle with Benzoin and Steri-Strips.  The patient tolerated the procedure well.  Sponge, lap, and needle count was correct x2 per the operating room staff.     Thornell Sartorius, MD     JB/MEDQ  D:  01/05/2014  T:  01/05/2014  Job:  [id number]

## 2014-01-05 NOTE — Transfer of Care (Signed)
Immediate Anesthesia Transfer of Care Note  Patient: Suzanne Andrade  Procedure(s) Performed: Procedure(s): REPEAT CESAREAN SECTION (N/A)  Patient Location: PACU  Anesthesia Type:Spinal  Level of Consciousness: awake, alert  and oriented  Airway & Oxygen Therapy: Patient Spontanous Breathing  Post-op Assessment: Report given to PACU RN and Post -op Vital signs reviewed and stable  Post vital signs: Reviewed and stable  Complications: No apparent anesthesia complications

## 2014-01-05 NOTE — Brief Op Note (Signed)
01/05/2014  8:46 AM  PATIENT:  Ok Edwards Noyola  38 y.o. female  PRE-OPERATIVE DIAGNOSIS:  Repeat C/Section, [phone]  POST-OPERATIVE DIAGNOSIS:  same  PROCEDURE:  Procedure(s): REPEAT CESAREAN SECTION (N/A)  SURGEON:  Surgeon(s) and Role:    * Janyth Contes, MD - Primary    * Logan Bores, MD - Assisting   ANESTHESIA:   epidural and spinal  EBL:  Total I/O In: 2500 [I.V.:2500] Out: 900 [Urine:200; Other:100; Blood:600]  BLOOD ADMINISTERED:none  DRAINS: Urinary Catheter (Foley)   LOCAL MEDICATIONS USED:  NONE  SPECIMEN:  Source of Specimen:  Placenta  DISPOSITION OF SPECIMEN:  L&D  COUNTS:  YES  TOURNIQUET:  * No tourniquets in log *  DICTATION: .Other Dictation: Dictation Number [id number]  PLAN OF CARE: Admit to inpatient   PATIENT DISPOSITION:  PACU - hemodynamically stable.   Delay start of Pharmacological VTE agent (>24hrs) due to surgical blood loss or risk of bleeding: not applicable

## 2014-01-05 NOTE — Anesthesia Preprocedure Evaluation (Signed)
Anesthesia Evaluation  Patient identified by MRN, date of birth, ID band Patient awake    Reviewed: Allergy & Precautions, H&P , NPO status , Patient's Chart, lab work & pertinent test results  Airway Mallampati: II TM Distance: >3 FB Neck ROM: full    Dental no notable dental hx.    Pulmonary neg pulmonary ROS,    Pulmonary exam normal       Cardiovascular hypertension, Pt. on home beta blockers     Neuro/Psych PSYCHIATRIC DISORDERS Anxiety Depression    GI/Hepatic negative GI ROS, Neg liver ROS,   Endo/Other  Morbid obesity  Renal/GU negative Renal ROS     Musculoskeletal   Abdominal   Peds  Hematology negative hematology ROS (+)   Anesthesia Other Findings   Reproductive/Obstetrics (+) Pregnancy                           Anesthesia Physical Anesthesia Plan  ASA: III  Anesthesia Plan: Spinal, Epidural and Combined Spinal and Epidural   Post-op Pain Management:    Induction:   Airway Management Planned:   Additional Equipment:   Intra-op Plan:   Post-operative Plan:   Informed Consent: I have reviewed the patients History and Physical, chart, labs and discussed the procedure including the risks, benefits and alternatives for the proposed anesthesia with the patient or authorized representative who has indicated his/her understanding and acceptance.     Plan Discussed with: CRNA, Anesthesiologist and Surgeon  Anesthesia Plan Comments:         Anesthesia Quick Evaluation

## 2014-01-05 NOTE — Interval H&P Note (Signed)
History and Physical Interval Note:  01/05/2014 7:10 AM  Ok Edwards Diss  has presented today for surgery, with the diagnosis of Repeat C/Section, [phone]  The various methods of treatment have been discussed with the patient and family. After consideration of risks, benefits and other options for treatment, the patient has consented to  Procedure(s): REPEAT CESAREAN SECTION (N/A) as a surgical intervention .  The patient's history has been reviewed, patient examined, no change in status, stable for surgery.  I have reviewed the patient's chart and labs.  Questions were answered to the patient's satisfaction.     Bovard-Stuckert, Harlan Ervine

## 2014-01-05 NOTE — Anesthesia Procedure Notes (Signed)
Spinal  Patient location during procedure: OR Start time: 01/05/2014 7:41 AM Staffing Anesthesiologist: CASSIDY, AMY Performed by: anesthesiologist  Preanesthetic Checklist Completed: patient identified, site marked, surgical consent, pre-op evaluation, timeout performed, IV checked, risks and benefits discussed and monitors and equipment checked Spinal Block Patient position: sitting Prep: site prepped and draped and DuraPrep Patient monitoring: cardiac monitor, continuous pulse ox, blood pressure and heart rate Approach: midline Location: L3-4 Injection technique: catheter Needle Needle type: Tuohy and Pencan  Needle gauge: 24 G Needle length: 12.7 cm Needle insertion depth: 7.5 cm Catheter type: closed end flexible Catheter size: 19 g Catheter at skin depth: 14 cm Assessment Sensory level: T4 Additional Notes CSE for BMI 49 and repeat C/S #3.  LOR to air at 7.5 cm, pencan passed with immediate return of clear free flow CSF on first attempt.  SAB dose given.  Pencan removed, tuohy flushed with 2 ml saline.  Epidural catheter placed.  Withdrawn to 12.5 cm at skin.  Pt then positioned in RLD, catheter retracted in to sub-q at 14 cm at skin.  Secured in this position. Patient tolerated procedure well with no apparent complications.  EPIDURAL CATHETER NOT TESTED.  Charlton Haws, MD

## 2014-01-05 NOTE — Anesthesia Postprocedure Evaluation (Signed)
  Anesthesia Post-op Note  Anesthesia Post Note  Patient: Suzanne Andrade  Procedure(s) Performed: Procedure(s) (LRB): REPEAT CESAREAN SECTION (N/A)  Anesthesia type: Spinal/Epidural  Patient location: PACU  Post pain: Pain level controlled  Post assessment: Post-op Vital signs reviewed  Post vital signs: Reviewed  Level of consciousness: awake  Complications: No apparent anesthesia complications

## 2014-01-06 ENCOUNTER — Encounter (HOSPITAL_COMMUNITY): Payer: Self-pay | Admitting: Obstetrics and Gynecology

## 2014-01-06 LAB — CBC
HCT: 33 % — ABNORMAL LOW (ref 36.0–46.0)
Hemoglobin: 11.2 g/dL — ABNORMAL LOW (ref 12.0–15.0)
MCH: 25.7 pg — ABNORMAL LOW (ref 26.0–34.0)
MCHC: 33.9 g/dL (ref 30.0–36.0)
MCV: 75.9 fL — ABNORMAL LOW (ref 78.0–100.0)
PLATELETS: 166 10*3/uL (ref 150–400)
RBC: 4.35 MIL/uL (ref 3.87–5.11)
RDW: 15.2 % (ref 11.5–15.5)
WBC: 13.3 10*3/uL — AB (ref 4.0–10.5)

## 2014-01-06 LAB — BIRTH TISSUE RECOVERY COLLECTION (PLACENTA DONATION)

## 2014-01-06 MED ORDER — RHO D IMMUNE GLOBULIN 1500 UNIT/2ML IJ SOSY
300.0000 ug | PREFILLED_SYRINGE | Freq: Once | INTRAMUSCULAR | Status: AC
  Administered 2014-01-06: 300 ug via INTRAMUSCULAR
  Filled 2014-01-06: qty 2

## 2014-01-06 NOTE — Progress Notes (Signed)
Subjective: Postpartum Day 1: Cesarean Delivery Patient reports incisional pain and tolerating PO.  Nl lochia, pain controlled.  duramorph wearing off.    Objective: Vital signs in last 24 hours: Temp:  [97.2 F (36.2 C)-98.4 F (36.9 C)] 98 F (36.7 C) (08/26 0230) Pulse Rate:  [55-82] 61 (08/26 0230) Resp:  [13-21] 18 (08/26 0230) BP: (104-149)/(42-86) 143/69 mmHg (08/26 0230) SpO2:  [97 %-100 %] 100 % (08/26 0230) Weight:  [128.368 kg (283 lb)] 128.368 kg (283 lb) (08/25 0700)  Physical Exam:  General: alert and no distress Lochia: appropriate Uterine Fundus: firm Incision: healing well, some blood on bandage DVT Evaluation: No evidence of DVT seen on physical exam.   Recent Labs  01/04/14 1119 01/06/14 0535  HGB 11.8* 11.2*  HCT 34.9* 33.0*    Assessment/Plan: Status post Cesarean section. Doing well postoperatively.  Continue current care.  Circumcision tomorrow, encourage ambulation.    Bovard-Stuckert, Melessia Kaus 01/06/2014, 6:03 AM

## 2014-01-06 NOTE — Progress Notes (Signed)
Ur chart review completed.  

## 2014-01-06 NOTE — Anesthesia Postprocedure Evaluation (Signed)
  Anesthesia Post-op Note  Patient: Suzanne Andrade  Procedure(s) Performed: Procedure(s): REPEAT CESAREAN SECTION (N/A)  Patient Location: Mother/Baby  Anesthesia Type:Spinal and Epidural  Level of Consciousness: awake  Airway and Oxygen Therapy: Patient Spontanous Breathing  Post-op Pain: none  Post-op Assessment: Post-op Vital signs reviewed, Patient's Cardiovascular Status Stable, Respiratory Function Stable, Patent Airway, No signs of Nausea or vomiting, Adequate PO intake, Pain level controlled, No headache, No backache, No residual numbness and No residual motor weakness  Post-op Vital Signs: Reviewed and stable  Last Vitals:  Filed Vitals:   01/06/14 0630  BP: 133/70  Pulse: 71  Temp: 36.8 C  Resp: 18    Complications: No apparent anesthesia complications

## 2014-01-06 NOTE — Addendum Note (Signed)
Addendum created 01/06/14 0848 by Tobin Chad, CRNA   Modules edited: Notes Section   Notes Section:  File: [id number]

## 2014-01-06 NOTE — Progress Notes (Signed)
Clinical Social Work Department PSYCHOSOCIAL ASSESSMENT - MATERNAL/CHILD 01/06/2014  Patient:  Suzanne Andrade,Suzanne Andrade  Account Number:  [id number]  Admit Date:  01/05/2014  Childs Name:   Timothy [name]    Clinical Social Worker:  Shital Crayton, CLINICAL SOCIAL WORKER   Date/Time:  01/06/2014 12:45 PM  Date Referred:  01/05/2014   Referral source  Central Nursery     Referred reason  Depression/Anxiety   Other referral source:    I:  FAMILY / HOME ENVIRONMENT Child's legal guardian:  PARENT  Guardian - Name Guardian - Age Guardian - Address  Suzanne Andrade 38 [address], [place], [place] [address]  Timothy [name]  same as above   Other household support members/support persons Name Relationship DOB   DAUGHTER 21   SON 19   DAUGHTER 16   DAUGHTER 13   MOTHER    Other support:   MOB shared that she has a very close relationship with her mother who lives in their home.  She stated that the FOB has a large family and is very supportive. MOB discussed supportive relationship with the FOB.    II  PSYCHOSOCIAL DATA Information Source:  Patient Interview  Financial and Community Resources Employment:   MOB shared that she does not work outside of the home. Per MOB, the FOB is employed and they are able to meet all basic needs.   Financial resources:  Medicaid If Medicaid - County:  [place] Other  Food Stamps  WIC   School / Grade:  N/A Maternity Care Coordinator / Child Services Coordination / Early Interventions:   N/A  Cultural issues impacting care:   None reported    III  STRENGTHS Strengths  Adequate Resources  Home prepared for Child (including basic supplies)  Supportive family/friends   Strength comment:    IV  RISK FACTORS AND CURRENT PROBLEMS Current Problem:  YES   Risk Factor & Current Problem Patient Issue Family Issue Risk Factor / Current Problem Comment  Mental Illness Y N MOB reports history of depression/bipolar.  She  stated that she is currently receiving outpatient therapy and medication management at the [facility].  She shared that she discontinued her medications while she was pregnant, but stated that her symptoms have been stabile and she intends to re-start medications once she is done breastfeeding (if not earlier).    V  SOCIAL WORK ASSESSMENT CSW met with MOB in order to complete the assessment. Consult ordered due to MOB presenting with a history of anxiety and depression.  MOB willingly engaged with CSW and was willing to discuss the reason for the consult.  MOB presented with full range and appropriate mood to the setting.  MOB presented with self-awareness related to her mental health symptoms and presented with motivation to continue to receive outpatient treatment for mental health symptoms during the postpartum period.   CSW did not note or observe any acute mental health symptoms.   MOB expressed excitement upon the birth of the baby.  She expressed that the FOB is also supportive as he becomes a first time father.  CSW inquired about thoughts and feelings as she readjusts to having a newborn as she has numerous older children, but MOB shared that she was excited to have another baby since she enjoys having young children around.  She smiled as she reflected upon the preparations she engaged in to have the home ready for his arrival, and stated that she believes she is well supported by   her family.   MOB denied history of postpartum depression with previous children.  She was receptive to reviewing signs and symptoms with CSW.  MOB aware of increased risk due to her mental health history.  MOB receptive to discussing her history of anxiety, depression, and bipolar.  Per MOB, she noted increase in depressive symptoms, including being tearful and irritable, when her children grew up. She stated that due to her symptoms, she has been tried on numerous medications including Abilify, Seroquel, and  Lamictal.  Per MOB, medications had numerous side effects and she was never continued on these medications.  She shared that prior to her pregnancy, she was only prescribed Adderall, which she discontinued when she learned that she was pregnant.  MOB shared that she has not had any acute symptoms while pregnant, but she verbalized understanding that this is common for women due to hormonal changes.  MOB shared that she is currently receiving outpatient therapy and medication management at the [facility], most recently seen 3 weeks ago.  MOB aware of importance of scheduling an appointment in upcoming weeks to follow-up to assess symptoms in postpartum period. MOB shared that she would like to breast feed, but discussed that if she needs to be placed on a medication to stabilize her symptoms, that she will take her medication since she is aware that her mental health symptoms may negative impact her ability to be a parent.  MOB able to verbalize symptoms that warrant professional intervention, and is able to discuss early signs that her symptoms are beginning to worsen.   No barriers to discharge.     VI SOCIAL WORK PLAN Social Work Plan  Patient/Family Education  No Further Intervention Required / No Barriers to Discharge   Type of pt/family education:   Postpartum depression   If child protective services report - county:   If child protective services report - date:   Information/referral to community resources comment:   N/A   Other social work plan:   Ongoing emotional support as needed     

## 2014-01-07 LAB — TYPE AND SCREEN
ABO/RH(D): O NEG
Antibody Screen: POSITIVE
DAT, IGG: NEGATIVE
UNIT DIVISION: 0
Unit division: 0

## 2014-01-07 LAB — RH IG WORKUP (INCLUDES ABO/RH)
ABO/RH(D): O NEG
Fetal Screen: NEGATIVE
GESTATIONAL AGE(WKS): 39
Unit division: 0

## 2014-01-07 MED ORDER — PRENATAL VITAMINS 28-0.8 MG PO TABS: 1.0000 | ORAL_TABLET | Freq: Every day | ORAL | Status: DC

## 2014-01-07 MED ORDER — OXYCODONE-ACETAMINOPHEN 5-325 MG PO TABS: 1.0000 | ORAL_TABLET | Freq: Four times a day (QID) | ORAL | Status: DC | PRN

## 2014-01-07 MED ORDER — IBUPROFEN 800 MG PO TABS: 800.0000 mg | ORAL_TABLET | Freq: Three times a day (TID) | ORAL | Status: DC | PRN

## 2014-01-07 NOTE — Lactation Note (Addendum)
This note was copied from the chart of [place]. Lactation Consultation Note  Patient Name: Suzanne Andrade LPNPY'Y Date: 01/07/2014 Reason for consult: Follow-up assessment Per mom I'm not sure if the baby is feeding well , baby woke up fussy ,  And LC assisted mom with latch . Reviewed hand expressing and noted a steady flow of colostrum , baby latched with ease  and with depth, multiply swallows noted , increased with breast compressions and mom seemed amazed. Per mom comfortable  With latch. Reviewed sore nipple and engorgement prevention and tx . Reviewed hand pump and increased flange to #27 due to the base appearing  alittle tight.  Also referred to the Baby and me booklet. Mother informed of post-discharge support and given phone number to the lactation department, including services for phone call assistance; out-patient  appointments; and breastfeeding support group. List of other breastfeeding resources in the community given in the handout. Encouraged mother to call for problems or concerns related to breastfeeding.   Maternal Data    Feeding Feeding Type: Breast Fed  LATCH Score/Interventions Latch: Grasps breast easily, tongue down, lips flanged, rhythmical sucking. Intervention(s): Skin to skin;Teach feeding cues;Waking techniques Intervention(s): Adjust position;Assist with latch;Breast massage;Breast compression  Audible Swallowing: Spontaneous and intermittent  Type of Nipple: Everted at rest and after stimulation  Comfort (Breast/Nipple): Soft / non-tender     Hold (Positioning): Assistance needed to correctly position infant at breast and maintain latch. Intervention(s): Breastfeeding basics reviewed;Support Pillows;Position options;Skin to skin  LATCH Score: 9  Lactation Tools Discussed/Used WIC Program: Yes Pump Review: Setup, frequency, and cleaning;Milk Storage Initiated by:: MAI  Date initiated:: 01/07/14   Consult Status Consult  Status: Complete Date: 01/07/14 Follow-up type: In-patient    Myer Haff 01/07/2014, 10:58 AM

## 2014-01-07 NOTE — Discharge Summary (Signed)
Obstetric Discharge Summary Reason for Admission: cesarean section Prenatal Procedures: none Intrapartum Procedures: cesarean: low cervical, transverse Postpartum Procedures: none Complications-Operative and Postpartum: none Hemoglobin  Date Value Ref Range Status  01/06/2014 11.2* 12.0 - 15.0 g/dL Final     HCT  Date Value Ref Range Status  01/06/2014 33.0* 36.0 - 46.0 % Final    Physical Exam:  General: alert and no distress Lochia: appropriate Uterine Fundus: firm Incision: healing well DVT Evaluation: No evidence of DVT seen on physical exam.  Discharge Diagnoses: Term Pregnancy-delivered  Discharge Information: Date: 01/07/2014 Activity: pelvic rest Diet: routine Medications: PNV, Ibuprofen and Percocet, labetalol Condition: stable Instructions: refer to practice specific booklet Discharge to: home Follow-up Information   Follow up with Bovard-Stuckert, Terrian Ridlon, MD. Schedule an appointment as soon as possible for a visit in 2 weeks. (Incision check, 6 wk postpartum/operative check)    Specialty:  Obstetrics and Gynecology   Contact information:   [address] [address] [phone]       Newborn Data: Live born female  Birth Weight: 7 lb 6.2 oz (3350 g) APGAR: 9, 9  Home with mother.  Bovard-Stuckert, Artemisa Sladek 01/07/2014, 8:01 AM

## 2014-01-07 NOTE — Lactation Note (Addendum)
This note was copied from the chart of [place]. Lactation Consultation Note Mom was attempting to BF baby in cradle position w/baby laying on his back turning head towards the breast. Mom c-section sitting in middle of bed w/no back support. Reposotioned mom in bed to be more comfortable. Encouraged d/t having c-section and abd. Distended and large abd. Making big hump and cradle feeding isn't the ideal feeding position at this time, encouraged football position. Mom stated it was very comfortable. Moms breast is pendulum shaped, mom stated breast hasn't been pointing down like that. Explained d/t fluid from surgery etc. Could have a factor in that reason. Elevated breast w/cloth. Encouraged to use "C" hold of the breast to latch baby. Hand expression demonstrated w/noted good colostrum. Mom encouraged to feed baby 8-12 times/24 hours and with feeding cues. Mom encouraged to waken baby for feeds. Mom reports + breast changes w/pregnancy.  Educated about newborn behavior. [facility] brochure given w/resources, support groups and [facility] services.Referred to Baby and Me Book in Breastfeeding section Pg. 22-23 for position options and Proper latch demonstration. This is moms 5th baby but has never BF before. Patient Name: Suzanne Andrade [id number] Date: 01/07/2014 Reason for consult: Initial assessment   Maternal Data Has patient been taught Hand Expression?: Yes Does the patient have breastfeeding experience prior to this delivery?: No  Feeding Feeding Type: Breast Fed Length of feed: 25 min  LATCH Score/Interventions Latch: Grasps breast easily, tongue down, lips flanged, rhythmical sucking. Intervention(s): Skin to skin;Teach feeding cues;Waking techniques Intervention(s): Adjust position;Assist with latch;Breast massage;Breast compression  Audible Swallowing: A few with stimulation Intervention(s): Skin to skin;Hand expression Intervention(s): Alternate breast massage  Type of Nipple:  Everted at rest and after stimulation  Comfort (Breast/Nipple): Soft / non-tender     Hold (Positioning): Assistance needed to correctly position infant at breast and maintain latch. Intervention(s): Breastfeeding basics reviewed;Support Pillows;Position options;Skin to skin  LATCH Score: 8  Lactation Tools Discussed/Used     Consult Status Consult Status: Follow-up Date: 01/07/14 Follow-up type: In-patient    Theodoro Kalata 01/07/2014, 4:19 AM

## 2014-01-07 NOTE — Progress Notes (Signed)
Subjective: Postpartum Day 2: Cesarean Delivery Patient reports incisional pain and tolerating PO.  Nl lochia, pain controlled  Objective: Vital signs in last 24 hours: Temp:  [98.1 F (36.7 C)-98.3 F (36.8 C)] 98.2 F (36.8 C) (08/27 0701) Pulse Rate:  [62-91] 77 (08/27 0701) Resp:  [18] 18 (08/27 0701) BP: (121-137)/(54-78) 126/61 mmHg (08/27 0701) SpO2:  [100 %] 100 % (08/27 0701)  Physical Exam:  General: alert and no distress Lochia: appropriate Uterine Fundus: firm Incision: healing well DVT Evaluation: No evidence of DVT seen on physical exam.   Recent Labs  01/04/14 1119 01/06/14 0535  HGB 11.8* 11.2*  HCT 34.9* 33.0*    Assessment/Plan: Status post Cesarean section. Doing well postoperatively.  Discharge home with standard precautions and return to clinic in 2 weeks.  D/C with motrin, percocet, and PNV.  Routine instructions, keep incision dry.    Bovard-Stuckert, Lindsie Simar 01/07/2014, 7:48 AM

## 2014-03-15 ENCOUNTER — Encounter (HOSPITAL_COMMUNITY): Payer: Self-pay | Admitting: Obstetrics and Gynecology

## 2014-11-22 ENCOUNTER — Other Ambulatory Visit: Payer: Self-pay | Admitting: Family Medicine

## 2014-11-22 DIAGNOSIS — M79605 Pain in left leg: Secondary | ICD-10-CM

## 2014-11-25 ENCOUNTER — Ambulatory Visit
Admission: RE | Admit: 2014-11-25 | Discharge: 2014-11-25 | Disposition: A | Discharge status: Home / Self Care | Payer: Medicaid Other | Source: Ambulatory Visit | Attending: Family Medicine | Admitting: Family Medicine

## 2014-11-25 DIAGNOSIS — M79605 Pain in left leg: Secondary | ICD-10-CM

## 2015-01-19 LAB — OB RESULTS CONSOLE ABO/RH: RH Type: NEGATIVE

## 2015-01-19 LAB — OB RESULTS CONSOLE HIV ANTIBODY (ROUTINE TESTING): HIV: NONREACTIVE

## 2015-01-19 LAB — OB RESULTS CONSOLE ANTIBODY SCREEN: Antibody Screen: NEGATIVE

## 2015-01-19 LAB — OB RESULTS CONSOLE GC/CHLAMYDIA
Chlamydia: NEGATIVE
GC PROBE AMP, GENITAL: NEGATIVE

## 2015-01-19 LAB — OB RESULTS CONSOLE HEPATITIS B SURFACE ANTIGEN: Hepatitis B Surface Ag: NEGATIVE

## 2015-01-19 LAB — OB RESULTS CONSOLE RPR: RPR: NONREACTIVE

## 2015-01-19 LAB — OB RESULTS CONSOLE RUBELLA ANTIBODY, IGM: Rubella: IMMUNE

## 2015-03-23 ENCOUNTER — Encounter: Payer: Self-pay | Admitting: Cardiovascular Disease

## 2015-03-23 ENCOUNTER — Ambulatory Visit (INDEPENDENT_AMBULATORY_CARE_PROVIDER_SITE_OTHER): Payer: Medicaid Other | Admitting: Cardiovascular Disease

## 2015-03-23 VITALS — BP 134/70 | HR 81 | Ht 64.0 in | Wt 279.1 lb

## 2015-03-23 DIAGNOSIS — I10 Essential (primary) hypertension: Secondary | ICD-10-CM

## 2015-03-23 LAB — CBC
HCT: 33.4 % — ABNORMAL LOW (ref 36.0–46.0)
Hemoglobin: 10.7 g/dL — ABNORMAL LOW (ref 12.0–15.0)
MCH: 25.1 pg — ABNORMAL LOW (ref 26.0–34.0)
MCHC: 32 g/dL (ref 30.0–36.0)
MCV: 78.2 fL (ref 78.0–100.0)
MPV: 9.7 fL (ref 8.6–12.4)
Platelets: 186 10*3/uL (ref 150–400)
RBC: 4.27 MIL/uL (ref 3.87–5.11)
RDW: 15 % (ref 11.5–15.5)
WBC: 8.9 10*3/uL (ref 4.0–10.5)

## 2015-03-23 LAB — D-DIMER, QUANTITATIVE (NOT AT ARMC): D DIMER QUANT: 0.32 ug{FEU}/mL (ref 0.00–0.48)

## 2015-03-23 LAB — T4: T4, Total: 10.8 ug/dL (ref 4.5–12.0)

## 2015-03-23 LAB — T3: T3 TOTAL: 181.4 ng/dL (ref 80.0–204.0)

## 2015-03-23 LAB — TSH: TSH: 0.353 u[IU]/mL (ref 0.350–4.500)

## 2015-03-23 LAB — BRAIN NATRIURETIC PEPTIDE: Brain Natriuretic Peptide: 51.7 pg/mL (ref 0.0–100.0)

## 2015-03-23 NOTE — Patient Instructions (Addendum)
Medication Instructions:  Your physician recommends that you continue on your current medications as directed. Please refer to the Current Medication list given to you today.  Labwork: Your physician recommends that you have lab work today CBC, TSH, D-dimer, BNP, T3 and T4   Testing/Procedures: Your physician has requested that you have an echocardiogram. Echocardiography is a painless test that uses sound waves to create images of your heart. It provides your doctor with information about the size and shape of your heart and how well your heart's chambers and valves are working. This procedure takes approximately one hour. There are no restrictions for this procedure.  Follow-Up: Your physician recommends that you schedule a follow-up appointment as needed.        If you need a refill on your cardiac medications before your next appointment, please call your pharmacy.  Echocardiogram An echocardiogram, or echocardiography, uses sound waves (ultrasound) to produce an image of your heart. The echocardiogram is simple, painless, obtained within a short period of time, and offers valuable information to your health care provider. The images from an echocardiogram can provide information such as:  Evidence of coronary artery disease (CAD).  Heart size.  Heart muscle function.  Heart valve function.  Aneurysm detection.  Evidence of a past heart attack.  Fluid buildup around the heart.  Heart muscle thickening.  Assess heart valve function. LET [facility] CARE PROVIDER KNOW ABOUT:  Any allergies you have.  All medicines you are taking, including vitamins, herbs, eye drops, creams, and over-the-counter medicines.  Previous problems you or members of your family have had with the use of anesthetics.  Any blood disorders you have.  Previous surgeries you have had.  Medical conditions you have.  Possibility of pregnancy, if this applies. BEFORE THE PROCEDURE  No  special preparation is needed. Eat and drink normally.  PROCEDURE   In order to produce an image of your heart, gel will be applied to your chest and a wand-like tool (transducer) will be moved over your chest. The gel will help transmit the sound waves from the transducer. The sound waves will harmlessly bounce off your heart to allow the heart images to be captured in real-time motion. These images will then be recorded.  You may need an IV to receive a medicine that improves the quality of the pictures. AFTER THE PROCEDURE You may return to your normal schedule including diet, activities, and medicines, unless your health care provider tells you otherwise.   This information is not intended to replace advice given to you by your health care provider. Make sure you discuss any questions you have with your health care provider.   Document Released: 04/27/2000 Document Revised: 05/21/2014 Document Reviewed: 01/05/2013 Elsevier Interactive Patient Education [employer].

## 2015-03-23 NOTE — Progress Notes (Signed)
Patient ID: Suzanne Andrade, female   DOB: [birth year], 39 y.o.   MRN: [id number]     Cardiology Office Note   Date:  03/23/2015   ID:  Suzanne Andrade, DOB 07-19-1975, MRN [id number]  PCP:  Suzanne Garbe, MD  Cardiologist:   Suzanne Rouge, MD   No chief complaint on file.     History of Present Illness: Suzanne Andrade is a 39 y.o. female who presents for evaluation of tachycardia.  Referred by Dr Lin Landsman MD [facility]  She is18 weeks pregnant with her 6th child.  Last year had palpitaitons and edema ? Seen by Dr Einar Gip.  No previous cardiac issues.  Non smoker.  Been on BP meds 6-7 years. When this pregnancy identified ACE stopped and placed on labatolol.  Has had atypical chest pain. Positional Not pleuritc worse on left side.  Dyspnea with exertion.  Has gained 15 lbs.  Obese to begin with.  Indicates previous thyroid studies "low" but MD said Suzanne.  No syncope.      Past Medical History  Diagnosis Date  . Hypertension   . Anxiety   . Headache(784.0)   . Depression   . Fibroid   . Vaginal Pap smear, abnormal     Suzanne since  . S/P cesarean section 01/05/2014    Past Surgical History  Procedure Laterality Date  . Cesarean section    . Cyst removal hand Right 1992  . Wisdom tooth extraction Bilateral 2007  . Induced abortion    . Dilation and curettage of uterus    . Cesarean section N/A 01/05/2014    Procedure: REPEAT CESAREAN SECTION;  Surgeon: Janyth Contes, MD;  Location: [facility];  Service: Obstetrics;  Laterality: N/A;     Current Outpatient Prescriptions  Medication Sig Dispense Refill  . ferrous sulfate 325 (65 FE) MG tablet Take 325 mg by mouth daily with breakfast.    . labetalol (NORMODYNE) 200 MG tablet Take 1 tablet (200 mg total) by mouth 2 (two) times daily. 60 tablet 2  . prenatal vitamin w/FE, FA (PRENATAL 1 + 1) 27-1 MG TABS tablet Take 1 tablet by mouth daily.  12   No current facility-administered medications for this visit.     Allergies:   Review of patient's allergies indicates no known allergies.    Social History:  The patient  reports that she has never smoked. She has never used smokeless tobacco. She reports that she does not drink alcohol or use illicit drugs.   Family History:  The patient's family history includes Diabetes in her father and maternal grandmother; GER disease in her mother; Hypertension in her father and mother; Mental illness in her maternal aunt, maternal uncle, and mother.    ROS:  Please see the history of present illness.   Otherwise, review of systems are positive for none.   All other systems are reviewed and negative.    PHYSICAL EXAM: VS:  There were no vitals taken for this visit. , BMI There is no weight on file to calculate BMI. Affect appropriate Overweight pregnant black female HEENT: normal Neck supple with no adenopathy JVP normal no bruits no thyromegaly Lungs clear with no wheezing and good diaphragmatic motion Heart:  S1/S2 SEM  murmur, no rub, gallop or click PMI normal Abdomen: benighn, BS positve, no tenderness, no AAA no bruit.  No HSM or HJR Distal pulses intact with no bruits No edema Neuro non-focal Skin warm and dry No muscular weakness    EKG:  NSR normal ECG 03/23/15     Recent Labs: No results found for requested labs within last 365 days.    Lipid Panel No results found for: CHOL, TRIG, HDL, CHOLHDL, VLDL, LDLCALC, LDLDIRECT    Wt Readings from Last 3 Encounters:  01/05/14 128.368 kg (283 lb)  01/04/14 128.731 kg (283 lb 12.8 oz)  06/26/13 122.38 kg (269 lb 12.8 oz)      Other studies Reviewed: Additional studies/ records that were reviewed today include: Primary care/Ob notes Epic notes/ labs .    ASSESSMENT AND PLAN:  1.  Dyspnea:  Seems functional but more than usual for patient. Likely related to pregnancy , weight gain and obesity.  F/U echo for RV/LV function.  Labs today To include d dimer, BNP, Thyroid and HCT 2.  HTN:  Continue labatolol   3. Tachycardia:  ECG normal with NSR and normal P waves  Continue beta blocker See labs above 4. Chest Pain atypical no need for further w/u during pregnancy   Current medicines are reviewed at length with the patient today.  The patient does not have concerns regarding medicines.  The following changes have been made:  no change  Labs/ tests ordered today include: Echo Labs   No orders of the defined types were placed in this encounter.     Disposition:   FU with PRN      Signed, Suzanne Rouge, MD  03/23/2015 10:13 AM    [facility] [address], [place], [place]  [address] Phone: [phone]; Fax: [phone]

## 2015-03-25 ENCOUNTER — Other Ambulatory Visit: Payer: Self-pay

## 2015-03-25 ENCOUNTER — Ambulatory Visit (HOSPITAL_COMMUNITY): Payer: Medicaid Other | Attending: Cardiology

## 2015-03-25 DIAGNOSIS — I10 Essential (primary) hypertension: Secondary | ICD-10-CM | POA: Diagnosis not present

## 2015-03-25 DIAGNOSIS — I509 Heart failure, unspecified: Secondary | ICD-10-CM | POA: Diagnosis present

## 2015-03-25 DIAGNOSIS — I340 Nonrheumatic mitral (valve) insufficiency: Secondary | ICD-10-CM | POA: Insufficient documentation

## 2015-07-22 LAB — OB RESULTS CONSOLE GBS: STREP GROUP B AG: POSITIVE

## 2015-08-05 ENCOUNTER — Inpatient Hospital Stay (HOSPITAL_COMMUNITY)
Admission: AD | Admit: 2015-08-05 | Discharge: 2015-08-05 | Disposition: A | Discharge status: Home / Self Care | Payer: Medicaid Other | Source: Intra-hospital | Attending: Obstetrics and Gynecology | Admitting: Obstetrics and Gynecology

## 2015-08-05 ENCOUNTER — Inpatient Hospital Stay (HOSPITAL_COMMUNITY): Payer: Medicaid Other

## 2015-08-05 ENCOUNTER — Encounter (HOSPITAL_COMMUNITY): Payer: Self-pay | Admitting: *Deleted

## 2015-08-05 DIAGNOSIS — O283 Abnormal ultrasonic finding on antenatal screening of mother: Secondary | ICD-10-CM | POA: Insufficient documentation

## 2015-08-05 DIAGNOSIS — O99213 Obesity complicating pregnancy, third trimester: Secondary | ICD-10-CM | POA: Insufficient documentation

## 2015-08-05 DIAGNOSIS — IMO0002 Reserved for concepts with insufficient information to code with codable children: Secondary | ICD-10-CM

## 2015-08-05 DIAGNOSIS — Z3A27 27 weeks gestation of pregnancy: Secondary | ICD-10-CM | POA: Insufficient documentation

## 2015-08-05 DIAGNOSIS — O09523 Supervision of elderly multigravida, third trimester: Secondary | ICD-10-CM | POA: Diagnosis not present

## 2015-08-05 MED ORDER — LACTATED RINGERS IV BOLUS (SEPSIS)
500.0000 mL | Freq: Once | INTRAVENOUS | Status: AC
  Administered 2015-08-05: 500 mL via INTRAVENOUS

## 2015-08-05 MED ORDER — OXYCODONE-ACETAMINOPHEN 5-325 MG PO TABS
2.0000 | ORAL_TABLET | Freq: Once | ORAL | Status: AC
  Administered 2015-08-05: 2 via ORAL
  Filled 2015-08-05: qty 2

## 2015-08-05 MED ORDER — NIFEDIPINE 10 MG PO CAPS
10.0000 mg | ORAL_CAPSULE | Freq: Once | ORAL | Status: AC
  Administered 2015-08-05: 10 mg via ORAL
  Filled 2015-08-05: qty 1

## 2015-08-05 NOTE — MAU Note (Signed)
PT SAYS HURT BAD SINCE 2PM -  LOWER ABD   THEN 5 PM  PAIN WENT  DOWN  HER LEGS.   VE  AT 74 WEEKS  DENIES   HSV AND MRSA.      GBS- NEG

## 2015-08-05 NOTE — MAU Note (Signed)
Pt states she started having irregular contractions and was seen in office yesterday . Has scheduled C/S in two weeks.

## 2015-08-05 NOTE — Discharge Instructions (Signed)
Fetal Movement Counts °Patient Name: __________________________________________________ Patient Due Date: ____________________ °Performing a fetal movement count is highly recommended in high-risk pregnancies, but it is good for every pregnant woman to do. Your health care provider may ask you to start counting fetal movements at 28 weeks of the pregnancy. Fetal movements often increase: °· After eating a full meal. °· After physical activity. °· After eating or drinking something sweet or cold. °· At rest. °Pay attention to when you feel the baby is most active. This will help you notice a pattern of your baby's sleep and wake cycles and what factors contribute to an increase in fetal movement. It is important to perform a fetal movement count at the same time each day when your baby is normally most active.  °HOW TO COUNT FETAL MOVEMENTS °1. Find a quiet and comfortable area to sit or lie down on your left side. Lying on your left side provides the best blood and oxygen circulation to your baby. °2. Write down the day and time on a sheet of paper or in a journal. °3. Start counting kicks, flutters, swishes, rolls, or jabs in a 2-hour period. You should feel at least 10 movements within 2 hours. °4. If you do not feel 10 movements in 2 hours, wait 2-3 hours and count again. Look for a change in the pattern or not enough counts in 2 hours. °SEEK MEDICAL CARE IF: °· You feel less than 10 counts in 2 hours, tried twice. °· There is no movement in over an hour. °· The pattern is changing or taking longer each day to reach 10 counts in 2 hours. °· You feel the baby is not moving as he or she usually does. °Date: ____________ Movements: ____________ Start time: ____________ Finish time: ____________  °Date: ____________ Movements: ____________ Start time: ____________ Finish time: ____________ °Date: ____________ Movements: ____________ Start time: ____________ Finish time: ____________ °Date: ____________ Movements:  ____________ Start time: ____________ Finish time: ____________ °Date: ____________ Movements: ____________ Start time: ____________ Finish time: ____________ °Date: ____________ Movements: ____________ Start time: ____________ Finish time: ____________ °Date: ____________ Movements: ____________ Start time: ____________ Finish time: ____________ °Date: ____________ Movements: ____________ Start time: ____________ Finish time: ____________  °Date: ____________ Movements: ____________ Start time: ____________ Finish time: ____________ °Date: ____________ Movements: ____________ Start time: ____________ Finish time: ____________ °Date: ____________ Movements: ____________ Start time: ____________ Finish time: ____________ °Date: ____________ Movements: ____________ Start time: ____________ Finish time: ____________ °Date: ____________ Movements: ____________ Start time: ____________ Finish time: ____________ °Date: ____________ Movements: ____________ Start time: ____________ Finish time: ____________ °Date: ____________ Movements: ____________ Start time: ____________ Finish time: ____________  °Date: ____________ Movements: ____________ Start time: ____________ Finish time: ____________ °Date: ____________ Movements: ____________ Start time: ____________ Finish time: ____________ °Date: ____________ Movements: ____________ Start time: ____________ Finish time: ____________ °Date: ____________ Movements: ____________ Start time: ____________ Finish time: ____________ °Date: ____________ Movements: ____________ Start time: ____________ Finish time: ____________ °Date: ____________ Movements: ____________ Start time: ____________ Finish time: ____________ °Date: ____________ Movements: ____________ Start time: ____________ Finish time: ____________  °Date: ____________ Movements: ____________ Start time: ____________ Finish time: ____________ °Date: ____________ Movements: ____________ Start time: ____________ Finish  time: ____________ °Date: ____________ Movements: ____________ Start time: ____________ Finish time: ____________ °Date: ____________ Movements: ____________ Start time: ____________ Finish time: ____________ °Date: ____________ Movements: ____________ Start time: ____________ Finish time: ____________ °Date: ____________ Movements: ____________ Start time: ____________ Finish time: ____________ °Date: ____________ Movements: ____________ Start time: ____________ Finish time: ____________  °Date: ____________ Movements: ____________ Start time: ____________ Finish   time: ____________ Date: ____________ Movements: ____________ Start time: ____________ Finish time: ____________ Date: ____________ Movements: ____________ Start time: ____________ Finish time: ____________ Date: ____________ Movements: ____________ Start time: ____________ Finish time: ____________ Date: ____________ Movements: ____________ Start time: ____________ Finish time: ____________ Date: ____________ Movements: ____________ Start time: ____________ Finish time: ____________ Date: ____________ Movements: ____________ Start time: ____________ Finish time: ____________  Date: ____________ Movements: ____________ Start time: ____________ Finish time: ____________ Date: ____________ Movements: ____________ Start time: ____________ Finish time: ____________ Date: ____________ Movements: ____________ Start time: ____________ Finish time: ____________ Date: ____________ Movements: ____________ Start time: ____________ Finish time: ____________ Date: ____________ Movements: ____________ Start time: ____________ Finish time: ____________ Date: ____________ Movements: ____________ Start time: ____________ Finish time: ____________ Date: ____________ Movements: ____________ Start time: ____________ Finish time: ____________  Date: ____________ Movements: ____________ Start time: ____________ Finish time: ____________ Date: ____________  Movements: ____________ Start time: ____________ Finish time: ____________ Date: ____________ Movements: ____________ Start time: ____________ Finish time: ____________ Date: ____________ Movements: ____________ Start time: ____________ Finish time: ____________ Date: ____________ Movements: ____________ Start time: ____________ Finish time: ____________ Date: ____________ Movements: ____________ Start time: ____________ Finish time: ____________ Date: ____________ Movements: ____________ Start time: ____________ Finish time: ____________  Date: ____________ Movements: ____________ Start time: ____________ Finish time: ____________ Date: ____________ Movements: ____________ Start time: ____________ Finish time: ____________ Date: ____________ Movements: ____________ Start time: ____________ Finish time: ____________ Date: ____________ Movements: ____________ Start time: ____________ Finish time: ____________ Date: ____________ Movements: ____________ Start time: ____________ Finish time: ____________ Date: ____________ Movements: ____________ Start time: ____________ Finish time: ____________   This information is not intended to replace advice given to you by your health care provider. Make sure you discuss any questions you have with your health care provider.   Document Released: 05/30/2006 Document Revised: 05/21/2014 Document Reviewed: 02/25/2012 Elsevier Interactive Patient Education 2016 Else[employer] Braxton Hicks Contractions Contractions of the uterus can occur throughout pregnancy. Contractions are not always a sign that you are in labor.  WHAT ARE BRAXTON HICKS CONTRACTIONS?  Contractions that occur before labor are called Braxton Hicks contractions, or false labor. Toward the end of pregnancy (32-34 weeks), these contractions can develop more often and may become more forceful. This is not true labor because these contractions do not result in opening (dilatation) and thinning of  the cervix. They are sometimes difficult to tell apart from true labor because these contractions can be forceful and people have different pain tolerances. You should not feel embarrassed if you go to the hospital with false labor. Sometimes, the only way to tell if you are in true labor is for your health care provider to look for changes in the cervix. If there are no prenatal problems or other health problems associated with the pregnancy, it is completely safe to be sent home with false labor and await the onset of true labor. HOW CAN YOU TELL THE DIFFERENCE BETWEEN TRUE AND FALSE LABOR? False Labor  The contractions of false labor are usually shorter and not as hard as those of true labor.   The contractions are usually irregular.   The contractions are often felt in the front of the lower abdomen and in the groin.   The contractions may go away when you walk around or change positions while lying down.   The contractions get weaker and are shorter lasting as time goes on.   The contractions do not usually become progressively stronger, regular, and closer together as with true labor.  True Labor 5. Contractions in true   labor last 30-70 seconds, become very regular, usually become more intense, and increase in frequency.  6. The contractions do not go away with walking.  7. The discomfort is usually felt in the top of the uterus and spreads to the lower abdomen and low back.  8. True labor can be determined by your health care provider with an exam. This will show that the cervix is dilating and getting thinner.  WHAT TO REMEMBER  Keep up with your usual exercises and follow other instructions given by your health care provider.   Take medicines as directed by your health care provider.   Keep your regular prenatal appointments.   Eat and drink lightly if you think you are going into labor.   If Braxton Hicks contractions are making you uncomfortable:   Change  your position from lying down or resting to walking, or from walking to resting.   Sit and rest in a tub of warm water.   Drink 2-3 glasses of water. Dehydration may cause these contractions.   Do slow and deep breathing several times an hour.  WHEN SHOULD I SEEK IMMEDIATE MEDICAL CARE? Seek immediate medical care if:  Your contractions become stronger, more regular, and closer together.   You have fluid leaking or gushing from your vagina.   You have a fever.   You pass blood-tinged mucus.   You have vaginal bleeding.   You have continuous abdominal pain.   You have low back pain that you never had before.   You feel your baby's head pushing down and causing pelvic pressure.   Your baby is not moving as much as it used to.    This information is not intended to replace advice given to you by your health care provider. Make sure you discuss any questions you have with your health care provider.   Document Released: 04/30/2005 Document Revised: 05/05/2013 Document Reviewed: 02/09/2013 Elsevier Interactive Patient Education 2016 [employer] Inc[employer]  

## 2015-08-10 ENCOUNTER — Encounter (HOSPITAL_COMMUNITY): Payer: Self-pay | Admitting: *Deleted

## 2015-08-10 ENCOUNTER — Telehealth (HOSPITAL_COMMUNITY): Payer: Self-pay | Admitting: *Deleted

## 2015-08-10 NOTE — Telephone Encounter (Signed)
Preadmission screen  

## 2015-08-16 ENCOUNTER — Encounter (HOSPITAL_COMMUNITY)
Admission: RE | Admit: 2015-08-16 | Discharge: 2015-08-16 | Disposition: A | Discharge status: Home / Self Care | Payer: Medicaid Other | Source: Ambulatory Visit | Attending: Obstetrics and Gynecology | Admitting: Obstetrics and Gynecology

## 2015-08-16 LAB — CBC
HCT: 34.2 % — ABNORMAL LOW (ref 36.0–46.0)
Hemoglobin: 11.3 g/dL — ABNORMAL LOW (ref 12.0–15.0)
MCH: 25.4 pg — AB (ref 26.0–34.0)
MCHC: 33 g/dL (ref 30.0–36.0)
MCV: 76.9 fL — ABNORMAL LOW (ref 78.0–100.0)
PLATELETS: 178 10*3/uL (ref 150–400)
RBC: 4.45 MIL/uL (ref 3.87–5.11)
RDW: 15.3 % (ref 11.5–15.5)
WBC: 10.9 10*3/uL — ABNORMAL HIGH (ref 4.0–10.5)

## 2015-08-16 LAB — BASIC METABOLIC PANEL
Anion gap: 5 (ref 5–15)
BUN: 6 mg/dL (ref 6–20)
CHLORIDE: 107 mmol/L (ref 101–111)
CO2: 23 mmol/L (ref 22–32)
CREATININE: 0.5 mg/dL (ref 0.44–1.00)
Calcium: 9.1 mg/dL (ref 8.9–10.3)
GFR calc non Af Amer: >60 mL/min (ref >60)
Glucose, Bld: 112 mg/dL — ABNORMAL HIGH (ref 65–99)
POTASSIUM: 3.9 mmol/L (ref 3.5–5.1)
Sodium: 135 mmol/L (ref 135–145)

## 2015-08-16 MED ORDER — DEXTROSE 5 % IV SOLN
3.0000 g | INTRAVENOUS | Status: AC
  Administered 2015-08-17: 3 g via INTRAVENOUS
  Filled 2015-08-16: qty 3000

## 2015-08-16 NOTE — H&P (Signed)
Suzanne Andrade is a 40 y.o. female [id number] at 12 wk for rLTCS.  Pregnancy dated by LMP cw first trimester [place].  Pregnancy complicated by maternal obesity, AMA and +GBBS.  Relatively uncomplicated PNC.  +FM, no LOF, no VB, occc tx.  D/w pt r/b/a of rLTCS. Pt voices understanding, wishes to proceed.  Pt also Rh neg, recerived Rhogam 1/17.  Aldo CHTN, on labetalol 200 bid - BP well - controlled.  Also h/o depression, better in pregnancies.    Maternal Medical History:  Contractions: Frequency: irregular.    Fetal activity: Perceived fetal activity is normal.    Prenatal complications: PIH.   Prenatal Complications - Diabetes: none.    OB History    Gravida Para Term Preterm AB TAB SAB Ectopic Multiple Living   7 5 5  1 1    5     G1 SVD, G2 FAVD, TAB x 1, LTCS x 3.  6lb-8lb.  G6 with PP PreE H/o trich +abn pap, h/o colpo and cryo, last WNL 7/16, HR HPV neg  Past Medical History  Diagnosis Date  . Hypertension   . Anxiety   . Headache(784.0)   . Fibroid   . Vaginal Pap smear, abnormal     ok since  . S/P cesarean section 01/05/2014  . Hx of varicella   . Depression     stopped meds with pregnancy   Past Surgical History  Procedure Laterality Date  . Cesarean section    . Cyst removal hand Right 1992  . Wisdom tooth extraction Bilateral 2007  . Induced abortion    . Dilation and curettage of uterus    . Cesarean section N/A 01/05/2014    Procedure: REPEAT CESAREAN SECTION;  Surgeon: Janyth Contes, MD;  Location: [facility];  Service: Obstetrics;  Laterality: N/A;   Family History: family history includes Diabetes in her father and maternal grandmother; GER disease in her mother; Hypertension in her father and mother; Mental illness in her maternal aunt, maternal uncle, and mother. Social History:  reports that she has never smoked. She has never used smokeless tobacco. She reports that she does not drink alcohol or use illicit drugs. long-term relationship; homemaker Meds  Labetalol and PNV All NKDA   Prenatal Transfer Tool  Maternal Diabetes: No Genetic Screening: Normal Maternal Ultrasounds/Referrals: Normal Fetal Ultrasounds or other Referrals:  None Maternal Substance Abuse:  No Significant Maternal Medications:  Meds include: Other: Labetalol 200mg  bid Significant Maternal Lab Results:  Lab values include: Group B Strep positive Other Comments:  nl genetic screening  Review of Systems  Constitutional: Negative.   HENT: Negative.   Eyes: Negative.   Respiratory: Negative.   Cardiovascular: Negative.   Gastrointestinal: Negative.   Genitourinary: Negative.   Musculoskeletal: Positive for back pain.  Skin: Negative.   Neurological: Negative.   Psychiatric/Behavioral: Negative.       unknown if currently breastfeeding. Maternal Exam:  Uterine Assessment: Contraction frequency is irregular.   Abdomen: Patient reports no abdominal tenderness. Surgical scars: low transverse.   Fundal height is appropriate for gestation.   Estimated fetal weight is 7.5-8.5#.   Fetal presentation: vertex  Introitus: Normal vulva. Normal vagina.    Physical Exam  Constitutional: She is oriented to person, place, and time. She appears well-developed and well-nourished.  HENT:  Head: Normocephalic and atraumatic.  Cardiovascular: Normal rate and regular rhythm.   Respiratory: Effort normal and breath sounds normal. No respiratory distress. She has no wheezes.  GI: Soft. Bowel sounds are normal. She  exhibits no distension. There is no tenderness.  Musculoskeletal: Normal range of motion.  Neurological: She is alert and oriented to person, place, and time.  Skin: Skin is warm and dry.  Psychiatric: She has a normal mood and affect. Her behavior is normal.    Prenatal labs: ABO, Rh: --/--/O NEG (04/04 1105) Antibody: NEG (04/04 1105) Rubella: Immune (09/07 0000) RPR: Nonreactive (09/07 0000)  HBsAg: Negative (09/07 0000)  HIV: Non-reactive (09/07 0000)   GBS: Positive (03/10 0000)   Rhogam 1/17  Hgb 11.2/Plt 235/Ur Cx neg/GC neg/ Chl neg/ Hgb electro WNL/glucola 98  First Tri [place] cw LMP First Tri [place] - nl NT Nl first tri screen and AFP Growth followed,  Anat completed after repeat US Assessment/Plan: 39yo [id number] at 81 for rLTCS Ancef for prophylaxis GBBS + D/w pt r/b/a Will proceed - need Traxi   Bovard-Stuckert, Abbie Jablon 08/16/2015, 10:16 PM

## 2015-08-17 ENCOUNTER — Encounter (HOSPITAL_COMMUNITY)
Admission: RE | Disposition: A | Discharge status: Home / Self Care | Payer: Self-pay | Source: Ambulatory Visit | Attending: Obstetrics and Gynecology

## 2015-08-17 ENCOUNTER — Inpatient Hospital Stay (HOSPITAL_COMMUNITY): Payer: Medicaid Other | Admitting: Anesthesiology

## 2015-08-17 ENCOUNTER — Inpatient Hospital Stay (HOSPITAL_COMMUNITY)
Admission: RE | Admit: 2015-08-17 | Discharge: 2015-08-20 | DRG: 765 | Disposition: A | Discharge status: Home / Self Care | Payer: Medicaid Other | Source: Ambulatory Visit | Attending: Obstetrics and Gynecology | Admitting: Obstetrics and Gynecology

## 2015-08-17 ENCOUNTER — Encounter (HOSPITAL_COMMUNITY): Payer: Self-pay

## 2015-08-17 DIAGNOSIS — F418 Other specified anxiety disorders: Secondary | ICD-10-CM | POA: Diagnosis present

## 2015-08-17 DIAGNOSIS — Z6791 Unspecified blood type, Rh negative: Secondary | ICD-10-CM

## 2015-08-17 DIAGNOSIS — O99344 Other mental disorders complicating childbirth: Secondary | ICD-10-CM | POA: Diagnosis present

## 2015-08-17 DIAGNOSIS — Z8249 Family history of ischemic heart disease and other diseases of the circulatory system: Secondary | ICD-10-CM

## 2015-08-17 DIAGNOSIS — Z3A39 39 weeks gestation of pregnancy: Secondary | ICD-10-CM

## 2015-08-17 DIAGNOSIS — Z833 Family history of diabetes mellitus: Secondary | ICD-10-CM

## 2015-08-17 DIAGNOSIS — O99824 Streptococcus B carrier state complicating childbirth: Secondary | ICD-10-CM | POA: Diagnosis present

## 2015-08-17 DIAGNOSIS — Z6841 Body Mass Index (BMI) 40.0 and over, adult: Secondary | ICD-10-CM | POA: Diagnosis not present

## 2015-08-17 DIAGNOSIS — O26893 Other specified pregnancy related conditions, third trimester: Secondary | ICD-10-CM | POA: Diagnosis present

## 2015-08-17 DIAGNOSIS — Z98891 History of uterine scar from previous surgery: Secondary | ICD-10-CM

## 2015-08-17 DIAGNOSIS — O34211 Maternal care for low transverse scar from previous cesarean delivery: Principal | ICD-10-CM | POA: Diagnosis present

## 2015-08-17 DIAGNOSIS — O1002 Pre-existing essential hypertension complicating childbirth: Secondary | ICD-10-CM | POA: Diagnosis present

## 2015-08-17 DIAGNOSIS — O99214 Obesity complicating childbirth: Secondary | ICD-10-CM | POA: Diagnosis present

## 2015-08-17 LAB — RPR: RPR Ser Ql: NONREACTIVE

## 2015-08-17 LAB — PREPARE RBC (CROSSMATCH)

## 2015-08-17 SURGERY — Surgical Case
Anesthesia: Spinal

## 2015-08-17 MED ORDER — SIMETHICONE 80 MG PO CHEW
80.0000 mg | CHEWABLE_TABLET | Freq: Three times a day (TID) | ORAL | Status: DC
  Administered 2015-08-18 – 2015-08-20 (×6): 80 mg via ORAL
  Filled 2015-08-17 (×8): qty 1

## 2015-08-17 MED ORDER — PHENYLEPHRINE 8 MG IN D5W 100 ML (0.08MG/ML) PREMIX OPTIME
INJECTION | INTRAVENOUS | Status: DC | PRN
  Administered 2015-08-17: 30 ug/min via INTRAVENOUS

## 2015-08-17 MED ORDER — NALBUPHINE HCL 10 MG/ML IJ SOLN: 5.0000 mg | Freq: Once | INTRAMUSCULAR | Status: DC | PRN

## 2015-08-17 MED ORDER — LANOLIN HYDROUS EX OINT: 1.0000 "application " | TOPICAL_OINTMENT | CUTANEOUS | Status: DC | PRN

## 2015-08-17 MED ORDER — KETOROLAC TROMETHAMINE 30 MG/ML IJ SOLN: 30.0000 mg | Freq: Four times a day (QID) | INTRAMUSCULAR | Status: AC | PRN

## 2015-08-17 MED ORDER — SENNOSIDES-DOCUSATE SODIUM 8.6-50 MG PO TABS
2.0000 | ORAL_TABLET | ORAL | Status: DC
  Administered 2015-08-17 – 2015-08-19 (×3): 2 via ORAL
  Filled 2015-08-17 (×3): qty 2

## 2015-08-17 MED ORDER — LABETALOL HCL 200 MG PO TABS
200.0000 mg | ORAL_TABLET | Freq: Two times a day (BID) | ORAL | Status: DC
  Administered 2015-08-17: 200 mg via ORAL
  Filled 2015-08-17 (×2): qty 1

## 2015-08-17 MED ORDER — ZOLPIDEM TARTRATE 5 MG PO TABS: 5.0000 mg | ORAL_TABLET | Freq: Every evening | ORAL | Status: DC | PRN

## 2015-08-17 MED ORDER — IBUPROFEN 600 MG PO TABS: 600.0000 mg | ORAL_TABLET | Freq: Four times a day (QID) | ORAL | Status: DC | PRN

## 2015-08-17 MED ORDER — DIPHENHYDRAMINE HCL 50 MG/ML IJ SOLN
12.5000 mg | INTRAMUSCULAR | Status: DC | PRN
  Administered 2015-08-17: 12.5 mg via INTRAVENOUS
  Filled 2015-08-17: qty 1

## 2015-08-17 MED ORDER — FENTANYL CITRATE (PF) 100 MCG/2ML IJ SOLN
INTRAMUSCULAR | Status: DC | PRN
  Administered 2015-08-17: 100 ug via EPIDURAL

## 2015-08-17 MED ORDER — ONDANSETRON HCL 4 MG/2ML IJ SOLN
4.0000 mg | Freq: Once | INTRAMUSCULAR | Status: AC | PRN
  Administered 2015-08-17: 4 mg via INTRAVENOUS

## 2015-08-17 MED ORDER — DEXTROSE 5 % IV SOLN
1.0000 ug/kg/h | INTRAVENOUS | Status: DC | PRN
  Filled 2015-08-17: qty 2

## 2015-08-17 MED ORDER — KETOROLAC TROMETHAMINE 30 MG/ML IJ SOLN: 30.0000 mg | Freq: Once | INTRAMUSCULAR | Status: DC

## 2015-08-17 MED ORDER — NALBUPHINE HCL 10 MG/ML IJ SOLN: 5.0000 mg | INTRAMUSCULAR | Status: DC | PRN

## 2015-08-17 MED ORDER — BUPIVACAINE IN DEXTROSE 0.75-8.25 % IT SOLN
INTRATHECAL | Status: AC
  Filled 2015-08-17: qty 2

## 2015-08-17 MED ORDER — ACETAMINOPHEN 325 MG PO TABS: 650.0000 mg | ORAL_TABLET | ORAL | Status: DC | PRN

## 2015-08-17 MED ORDER — SCOPOLAMINE 1 MG/3DAYS TD PT72: 1.0000 | MEDICATED_PATCH | Freq: Once | TRANSDERMAL | Status: DC

## 2015-08-17 MED ORDER — DIBUCAINE 1 % RE OINT: 1.0000 "application " | TOPICAL_OINTMENT | RECTAL | Status: DC | PRN

## 2015-08-17 MED ORDER — MEPERIDINE HCL 25 MG/ML IJ SOLN: 6.2500 mg | INTRAMUSCULAR | Status: DC | PRN

## 2015-08-17 MED ORDER — LACTATED RINGERS IV SOLN
INTRAVENOUS | Status: DC
  Administered 2015-08-17: 125 mL/h via INTRAVENOUS

## 2015-08-17 MED ORDER — OXYTOCIN 10 UNIT/ML IJ SOLN: 2.5000 [IU]/h | INTRAVENOUS | Status: AC

## 2015-08-17 MED ORDER — METOCLOPRAMIDE HCL 5 MG/ML IJ SOLN
10.0000 mg | Freq: Three times a day (TID) | INTRAMUSCULAR | Status: DC | PRN
  Administered 2015-08-17: 10 mg via INTRAVENOUS
  Filled 2015-08-17: qty 2

## 2015-08-17 MED ORDER — WITCH HAZEL-GLYCERIN EX PADS: 1.0000 "application " | MEDICATED_PAD | CUTANEOUS | Status: DC | PRN

## 2015-08-17 MED ORDER — OXYCODONE-ACETAMINOPHEN 5-325 MG PO TABS
1.0000 | ORAL_TABLET | ORAL | Status: DC | PRN
  Administered 2015-08-18 – 2015-08-20 (×11): 1 via ORAL
  Filled 2015-08-17 (×11): qty 1

## 2015-08-17 MED ORDER — PHENYLEPHRINE 8 MG IN D5W 100 ML (0.08MG/ML) PREMIX OPTIME
INJECTION | INTRAVENOUS | Status: AC
  Filled 2015-08-17: qty 100

## 2015-08-17 MED ORDER — KETOROLAC TROMETHAMINE 30 MG/ML IJ SOLN
30.0000 mg | Freq: Four times a day (QID) | INTRAMUSCULAR | Status: AC | PRN
  Administered 2015-08-17: 30 mg via INTRAMUSCULAR

## 2015-08-17 MED ORDER — SODIUM CHLORIDE 0.9 % IV SOLN
8.0000 mg | Freq: Once | INTRAVENOUS | Status: AC
  Administered 2015-08-17: 8 mg via INTRAVENOUS
  Filled 2015-08-17: qty 4

## 2015-08-17 MED ORDER — SIMETHICONE 80 MG PO CHEW: 80.0000 mg | CHEWABLE_TABLET | ORAL | Status: DC | PRN

## 2015-08-17 MED ORDER — SCOPOLAMINE 1 MG/3DAYS TD PT72
1.0000 | MEDICATED_PATCH | Freq: Once | TRANSDERMAL | Status: DC
  Administered 2015-08-17: 1.5 mg via TRANSDERMAL

## 2015-08-17 MED ORDER — SODIUM CHLORIDE 0.9% FLUSH: 3.0000 mL | INTRAVENOUS | Status: DC | PRN

## 2015-08-17 MED ORDER — NALOXONE HCL 0.4 MG/ML IJ SOLN: 0.4000 mg | INTRAMUSCULAR | Status: DC | PRN

## 2015-08-17 MED ORDER — ONDANSETRON HCL 4 MG/2ML IJ SOLN
INTRAMUSCULAR | Status: DC | PRN
  Administered 2015-08-17: 4 mg via INTRAVENOUS

## 2015-08-17 MED ORDER — ONDANSETRON HCL 4 MG/2ML IJ SOLN: 4.0000 mg | Freq: Three times a day (TID) | INTRAMUSCULAR | Status: DC | PRN

## 2015-08-17 MED ORDER — DEXAMETHASONE SODIUM PHOSPHATE 4 MG/ML IJ SOLN
INTRAMUSCULAR | Status: DC | PRN
  Administered 2015-08-17: 4 mg via INTRAVENOUS

## 2015-08-17 MED ORDER — OXYTOCIN 10 UNIT/ML IJ SOLN
INTRAMUSCULAR | Status: AC
  Filled 2015-08-17: qty 4

## 2015-08-17 MED ORDER — OXYCODONE-ACETAMINOPHEN 5-325 MG PO TABS: 2.0000 | ORAL_TABLET | ORAL | Status: DC | PRN

## 2015-08-17 MED ORDER — LACTATED RINGERS IV SOLN
INTRAVENOUS | Status: DC
  Administered 2015-08-17 (×3): via INTRAVENOUS

## 2015-08-17 MED ORDER — FENTANYL CITRATE (PF) 100 MCG/2ML IJ SOLN
INTRAMUSCULAR | Status: AC
  Filled 2015-08-17: qty 2

## 2015-08-17 MED ORDER — ACETAMINOPHEN 500 MG PO TABS
1000.0000 mg | ORAL_TABLET | Freq: Four times a day (QID) | ORAL | Status: AC
  Administered 2015-08-17 – 2015-08-18 (×2): 1000 mg via ORAL
  Filled 2015-08-17 (×2): qty 2

## 2015-08-17 MED ORDER — SCOPOLAMINE 1 MG/3DAYS TD PT72
MEDICATED_PATCH | TRANSDERMAL | Status: AC
  Administered 2015-08-17: 1.5 mg via TRANSDERMAL
  Filled 2015-08-17: qty 1

## 2015-08-17 MED ORDER — IBUPROFEN 800 MG PO TABS
800.0000 mg | ORAL_TABLET | Freq: Three times a day (TID) | ORAL | Status: DC
  Administered 2015-08-17 – 2015-08-20 (×8): 800 mg via ORAL
  Filled 2015-08-17 (×8): qty 1

## 2015-08-17 MED ORDER — DIPHENHYDRAMINE HCL 25 MG PO CAPS: 25.0000 mg | ORAL_CAPSULE | Freq: Four times a day (QID) | ORAL | Status: DC | PRN

## 2015-08-17 MED ORDER — PHENYLEPHRINE 40 MCG/ML (10ML) SYRINGE FOR IV PUSH (FOR BLOOD PRESSURE SUPPORT)
PREFILLED_SYRINGE | INTRAVENOUS | Status: AC
  Filled 2015-08-17: qty 20

## 2015-08-17 MED ORDER — ONDANSETRON HCL 4 MG/2ML IJ SOLN
INTRAMUSCULAR | Status: AC
  Filled 2015-08-17: qty 2

## 2015-08-17 MED ORDER — SODIUM CHLORIDE 0.9 % IR SOLN
Status: DC | PRN
  Administered 2015-08-17: 1000 mL

## 2015-08-17 MED ORDER — TETANUS-DIPHTH-ACELL PERTUSSIS 5-2.5-18.5 LF-MCG/0.5 IM SUSP
0.5000 mL | Freq: Once | INTRAMUSCULAR | Status: AC
  Administered 2015-08-18: 0.5 mL via INTRAMUSCULAR
  Filled 2015-08-17: qty 0.5

## 2015-08-17 MED ORDER — KETOROLAC TROMETHAMINE 30 MG/ML IJ SOLN
INTRAMUSCULAR | Status: AC
  Filled 2015-08-17: qty 1

## 2015-08-17 MED ORDER — HYDROMORPHONE HCL 1 MG/ML IJ SOLN
INTRAMUSCULAR | Status: AC
  Filled 2015-08-17: qty 1

## 2015-08-17 MED ORDER — NALBUPHINE HCL 10 MG/ML IJ SOLN
5.0000 mg | Freq: Once | INTRAMUSCULAR | Status: DC | PRN
Start: 2015-08-17 — End: 2015-08-20

## 2015-08-17 MED ORDER — LACTATED RINGERS IV SOLN
INTRAVENOUS | Status: DC
  Administered 2015-08-17: 16:00:00 via INTRAVENOUS

## 2015-08-17 MED ORDER — PRENATAL MULTIVITAMIN CH
1.0000 | ORAL_TABLET | Freq: Every day | ORAL | Status: DC
  Administered 2015-08-18 – 2015-08-20 (×3): 1 via ORAL
  Filled 2015-08-17 (×3): qty 1

## 2015-08-17 MED ORDER — HYDROMORPHONE HCL 1 MG/ML IJ SOLN
0.2500 mg | INTRAMUSCULAR | Status: DC | PRN
  Administered 2015-08-17 (×2): 0.25 mg via INTRAVENOUS

## 2015-08-17 MED ORDER — DIPHENHYDRAMINE HCL 25 MG PO CAPS
25.0000 mg | ORAL_CAPSULE | ORAL | Status: DC | PRN
  Administered 2015-08-18: 25 mg via ORAL
  Filled 2015-08-17: qty 1

## 2015-08-17 MED ORDER — BUPIVACAINE IN DEXTROSE 0.75-8.25 % IT SOLN
INTRATHECAL | Status: DC | PRN
Start: 2015-08-17 — End: 2015-08-17
  Administered 2015-08-17: 1 mL via INTRATHECAL

## 2015-08-17 MED ORDER — NALBUPHINE HCL 10 MG/ML IJ SOLN
5.0000 mg | INTRAMUSCULAR | Status: DC | PRN
  Filled 2015-08-17: qty 1

## 2015-08-17 MED ORDER — MORPHINE SULFATE (PF) 0.5 MG/ML IJ SOLN
INTRAMUSCULAR | Status: AC
  Filled 2015-08-17: qty 10

## 2015-08-17 MED ORDER — DEXAMETHASONE SODIUM PHOSPHATE 4 MG/ML IJ SOLN
INTRAMUSCULAR | Status: AC
  Filled 2015-08-17: qty 1

## 2015-08-17 MED ORDER — MORPHINE SULFATE (PF) 0.5 MG/ML IJ SOLN
INTRAMUSCULAR | Status: DC | PRN
  Administered 2015-08-17: 3 mg via EPIDURAL

## 2015-08-17 MED ORDER — MENTHOL 3 MG MT LOZG: 1.0000 | LOZENGE | OROMUCOSAL | Status: DC | PRN

## 2015-08-17 MED ORDER — OXYTOCIN 10 UNIT/ML IJ SOLN
40.0000 [IU] | INTRAVENOUS | Status: DC | PRN
  Administered 2015-08-17: 40 [IU] via INTRAVENOUS

## 2015-08-17 MED ORDER — SIMETHICONE 80 MG PO CHEW
80.0000 mg | CHEWABLE_TABLET | ORAL | Status: DC
  Administered 2015-08-17 – 2015-08-19 (×3): 80 mg via ORAL
  Filled 2015-08-17 (×3): qty 1

## 2015-08-17 MED ORDER — PHENYLEPHRINE HCL 10 MG/ML IJ SOLN
INTRAMUSCULAR | Status: DC | PRN
  Administered 2015-08-17: 80 ug via INTRAVENOUS
  Administered 2015-08-17: 40 ug via INTRAVENOUS

## 2015-08-17 SURGICAL SUPPLY — 38 items
BENZOIN TINCTURE PRP APPL 2/3 (GAUZE/BANDAGES/DRESSINGS) ×3 IMPLANT
CHLORAPREP W/TINT 26ML (MISCELLANEOUS) ×3 IMPLANT
CLAMP CORD UMBIL (MISCELLANEOUS) IMPLANT
CLOSURE WOUND 1/2 X4 (GAUZE/BANDAGES/DRESSINGS) ×1
CLOTH BEACON ORANGE TIMEOUT ST (SAFETY) ×3 IMPLANT
CONTAINER PREFILL 10% NBF 15ML (MISCELLANEOUS) IMPLANT
DRSG OPSITE POSTOP 4X10 (GAUZE/BANDAGES/DRESSINGS) ×3 IMPLANT
ELECT REM PT RETURN 9FT ADLT (ELECTROSURGICAL) ×3
ELECTRODE REM PT RTRN 9FT ADLT (ELECTROSURGICAL) ×1 IMPLANT
EXTRACTOR VACUUM M CUP 4 TUBE (SUCTIONS) IMPLANT
EXTRACTOR VACUUM M CUP 4' TUBE (SUCTIONS)
GLOVE BIO SURGEON STRL SZ 6.5 (GLOVE) ×2 IMPLANT
GLOVE BIO SURGEONS STRL SZ 6.5 (GLOVE) ×1
GLOVE BIOGEL PI IND STRL 7.0 (GLOVE) ×1 IMPLANT
GLOVE BIOGEL PI INDICATOR 7.0 (GLOVE) ×2
GOWN STRL REUS W/TWL LRG LVL3 (GOWN DISPOSABLE) ×6 IMPLANT
KIT ABG SYR 3ML LUER SLIP (SYRINGE) IMPLANT
NEEDLE HYPO 25X5/8 SAFETYGLIDE (NEEDLE) IMPLANT
NS IRRIG 1000ML POUR BTL (IV SOLUTION) ×3 IMPLANT
PACK C SECTION WH (CUSTOM PROCEDURE TRAY) ×3 IMPLANT
PAD OB MATERNITY 4.3X12.25 (PERSONAL CARE ITEMS) ×3 IMPLANT
PENCIL SMOKE EVAC W/HOLSTER (ELECTROSURGICAL) ×3 IMPLANT
RETRACTOR TRAXI PANNICULUS (MISCELLANEOUS) ×1 IMPLANT
RTRCTR C-SECT PINK 25CM LRG (MISCELLANEOUS) ×3 IMPLANT
STRIP CLOSURE SKIN 1/2X4 (GAUZE/BANDAGES/DRESSINGS) ×2 IMPLANT
SUT MNCRL 0 VIOLET CTX 36 (SUTURE) ×2 IMPLANT
SUT MONOCRYL 0 CTX 36 (SUTURE) ×4
SUT PLAIN 1 NONE 54 (SUTURE) IMPLANT
SUT PLAIN 2 0 XLH (SUTURE) ×3 IMPLANT
SUT VIC AB 0 CT1 27 (SUTURE) ×4
SUT VIC AB 0 CT1 27XBRD ANBCTR (SUTURE) ×2 IMPLANT
SUT VIC AB 2-0 CT1 27 (SUTURE) ×2
SUT VIC AB 2-0 CT1 TAPERPNT 27 (SUTURE) ×1 IMPLANT
SUT VIC AB 4-0 KS 27 (SUTURE) ×3 IMPLANT
SYR BULB IRRIGATION 50ML (SYRINGE) ×3 IMPLANT
TOWEL OR 17X24 6PK STRL BLUE (TOWEL DISPOSABLE) ×3 IMPLANT
TRAXI PANNICULUS RETRACTOR (MISCELLANEOUS) ×2
TRAY FOLEY CATH SILVER 14FR (SET/KITS/TRAYS/PACK) ×3 IMPLANT

## 2015-08-17 NOTE — Anesthesia Postprocedure Evaluation (Signed)
Anesthesia Post Note  Patient: Suzanne Andrade  Procedure(s) Performed: Procedure(s) (LRB): CESAREAN SECTION (N/A)  Patient location during evaluation: PACU Anesthesia Type: Spinal Level of consciousness: awake Pain management: pain level controlled Vital Signs Assessment: post-procedure vital signs reviewed and stable Respiratory status: spontaneous breathing Cardiovascular status: stable Postop Assessment: no headache, spinal receding, no backache and patient able to bend at knees Anesthetic complications: no    Last Vitals:  Filed Vitals:   08/17/15 2220 08/17/15 2244  BP:  116/68  Pulse:  80  Temp: 36.4 C   Resp: 16     Last Pain:  Filed Vitals:   08/17/15 2246  PainSc: 0-No pain                 Johannah Rozas JR,JOHN Lanique Gonzalo

## 2015-08-17 NOTE — Anesthesia Postprocedure Evaluation (Signed)
Anesthesia Post Note  Patient: Suzanne Andrade  Procedure(s) Performed: Procedure(s) (LRB): CESAREAN SECTION (N/A)  Patient location during evaluation: Mother Baby Anesthesia Type: Combined Spinal/Epidural Level of consciousness: awake, awake and alert, oriented and patient cooperative Pain management: pain level controlled Vital Signs Assessment: post-procedure vital signs reviewed and stable Respiratory status: spontaneous breathing, nonlabored ventilation and respiratory function stable Cardiovascular status: stable Postop Assessment: no headache, no backache, patient able to bend at knees and no signs of nausea or vomiting Anesthetic complications: no    Last Vitals:  Filed Vitals:   08/17/15 1329 08/17/15 1430  BP: 134/87 146/65  Pulse: 67 67  Temp: 36.7 C 36.8 C  Resp: 18 18    Last Pain:  Filed Vitals:   08/17/15 1451  PainSc: 0-No pain                 Ymani Porcher L

## 2015-08-17 NOTE — Progress Notes (Signed)
Patient has had severe N/V post op. She did receive 3mg  of Duramorph in her epidural catheter. She has a Hx/o motion sickness. She was given Reglan 10 mg IV earlier. She states her nausea is improved. I recommend that she also get some Benadryl IV which typically helps patients with a Hx/o motion sickness.

## 2015-08-17 NOTE — Interval H&P Note (Signed)
History and Physical Interval Note:  08/17/2015 9:07 AM  Suzanne Andrade  has presented today for surgery, with the diagnosis of Repeat C/Section  The various methods of treatment have been discussed with the patient and family. After consideration of risks, benefits and other options for treatment, the patient has consented to  Procedure(s): CESAREAN SECTION (N/A) as a surgical intervention .  The patient's history has been reviewed, patient examined, no change in status, stable for surgery.  I have reviewed the patient's chart and labs.  Questions were answered to the patient's satisfaction.     Bovard-Stuckert, Bentli Llorente

## 2015-08-17 NOTE — Transfer of Care (Signed)
Immediate Anesthesia Transfer of Care Note  Patient: Suzanne Andrade  Procedure(s) Performed: Procedure(s): CESAREAN SECTION (N/A)  Patient Location: PACU  Anesthesia Type:Spinal  Level of Consciousness: awake, alert  and oriented  Airway & Oxygen Therapy: Patient Spontanous Breathing  Post-op Assessment: Report given to RN and Post -op Vital signs reviewed and stable  Post vital signs: Reviewed and stable  Last Vitals:  Filed Vitals:   08/17/15 0808  BP: 139/70  Pulse: 87  Temp: 36.9 C  Resp: 18    Complications: No apparent anesthesia complications

## 2015-08-17 NOTE — Progress Notes (Signed)
MD notified regarding patient nausea and vomitting.  See new order.

## 2015-08-17 NOTE — Lactation Note (Signed)
This note was copied from a baby's chart. Lactation Consultation Note  Patient Name: Suzanne Andrade [id number] Date: 08/17/2015 Reason for consult: Initial assessment;NICU baby   Initial consult with mom of 39 week NICU Infant. Infant was transferred to NICU after delivery by c/s and is being treated for Meconium aspiration. Mom has not been able to see infant yet and is eager to begin pumping. Mom very naseus but still wanted to begin pumping.   DEBP set up at bedside. Mom and dad taught about set up, disassembling, cleaning, pumping and NICU Breastmilk storage. Mom with large compressible breasts and small short shafted everted nipples. Mom reports she pumped with last child and used the #21 flanges. Gave mom set of #21 flanges to use. Pumped mom for 15 minutes on Initate setting, received 1 cc EBM from right breast. Hand Expressed both breasts and received a total of 2 cc EBM, which I took to NICU. Infant is currently NPO. Mom was pleased to see colostrum, explained what to expect with milk coming to volume.   Gave mom Providing Milk for Your Baby in NICU Booklet and reviewed pumping every 2-3 hour with DEBP on Initiate setting followed by hand expression. Mom voiced understanding to plan. Reviewed NICU Breast milk storage. Colostrum collection containers, # labels, BM labels and bottles at bedside. Gave mom BF Resources Handout. She is a [facility] client and is aware to call for an appointment after her d/c. Mom has a DEBP at home for use after d/c.   LC Brochure given, mom aware of IP Services at this time. Enc mom to call for questions/concerns/assistance as needed. Follow up tomorrow and prn.       Maternal Data Formula Feeding for Exclusion: No Has patient been taught Hand Expression?: Yes Does the patient have breastfeeding experience prior to this delivery?: No (Mom reports she did not BF her first 4 children, her 5th child she was ill with preeclampsia and was unable to BF, she did pump  some with 5th child)  Feeding    LATCH Score/Interventions                      Lactation Tools Discussed/Used WIC Program: Yes Pump Review: Setup, frequency, and cleaning;Milk Storage (NICU Milk storage) Initiated by:: Nonah Mattes, RN, IBCLC Date initiated:: 08/17/15   Consult Status Consult Status: Follow-up Date: 08/18/15 Follow-up type: In-patient    Debby Freiberg Pau Banh 08/17/2015, 4:36 PM

## 2015-08-17 NOTE — Brief Op Note (Signed)
08/17/2015  11:14 AM  PATIENT:  Suzanne Andrade  40 y.o. female  PRE-OPERATIVE DIAGNOSIS:  Repeat C/Section; intrauterine pregnancy at term, history of low transverse cesarean section  POST-OPERATIVE DIAGNOSIS:  Repeat C/Section; intrauterine pregnancy at term, history of low transverse cesarean section  PROCEDURE:  Procedure(s): CESAREAN SECTION (N/A)  SURGEON:  Surgeon(s) and Role:    * Janyth Contes, MD - Primary  ASSISTANTS: Krietemeyer, Heather RNFA   FINDINGS: viable female infant at 10:02, apgars 6/8, wt 7#11, nl PP uterus, tubes and ovaries.   ANESTHESIA:   Combined spinal/epidural  EBL:  Total I/O In: 2700 [I.V.:2700] Out: 950 [Urine:150; Blood:800]  BLOOD ADMINISTERED:none  DRAINS: Urinary Catheter (Foley)   LOCAL MEDICATIONS USED:  NONE  SPECIMEN:  Source of Specimen:  Placenta  DISPOSITION OF SPECIMEN:  L&D  COUNTS:  YES  TOURNIQUET:  * No tourniquets in log *  DICTATION: .Other Dictation: Dictation Number [id number]  PLAN OF CARE: Admit to inpatient   PATIENT DISPOSITION:  PACU - hemodynamically stable.   Delay start of Pharmacological VTE agent (>24hrs) due to surgical blood loss or risk of bleeding: not applicable

## 2015-08-17 NOTE — Anesthesia Procedure Notes (Signed)
Spinal Patient location during procedure: OR Start time: 08/17/2015 9:28 AM End time: 08/17/2015 9:34 AM Staffing Anesthesiologist: Lyn Hollingshead Performed by: anesthesiologist  Preanesthetic Checklist Completed: patient identified, surgical consent, pre-op evaluation, timeout performed, IV checked, risks and benefits discussed and monitors and equipment checked Spinal Block Patient position: sitting Prep: site prepped and draped and DuraPrep Patient monitoring: cardiac monitor, continuous pulse ox, blood pressure and heart rate Approach: midline Location: L3-4 Injection technique: catheter Needle Needle type: Tuohy and Sprotte  Needle gauge: 24 G Needle length: 12.7 cm Needle insertion depth: 9 cm Catheter type: closed end flexible Catheter size: 19 g Catheter at skin depth: 15 cm Assessment Sensory level: T4

## 2015-08-17 NOTE — Anesthesia Postprocedure Evaluation (Signed)
Anesthesia Post Note  Patient: Suzanne Andrade  Procedure(s) Performed: Procedure(s) (LRB): CESAREAN SECTION (N/A)  Patient location during evaluation: PACU Anesthesia Type: Spinal Level of consciousness: awake and alert Pain management: pain level controlled Vital Signs Assessment: post-procedure vital signs reviewed and stable Respiratory status: spontaneous breathing and respiratory function stable Cardiovascular status: blood pressure returned to baseline and stable Postop Assessment: spinal receding Anesthetic complications: no    Last Vitals:  Filed Vitals:   08/17/15 1200 08/17/15 1215  BP: 140/80 154/84  Pulse: 74 110  Temp:  36.7 C  Resp: 18 20    Last Pain:  Filed Vitals:   08/17/15 1226  PainSc: 4                  Tiajuana Amass

## 2015-08-17 NOTE — Anesthesia Preprocedure Evaluation (Addendum)
Anesthesia Evaluation  Patient identified by MRN, date of birth, ID band Patient awake    Reviewed: Allergy & Precautions, NPO status , Patient's Chart, lab work & pertinent test results  Airway Mallampati: III       Dental no notable dental hx.    Pulmonary neg pulmonary ROS,    Pulmonary exam normal        Cardiovascular hypertension, Pt. on medications Normal cardiovascular exam  Chronic HTN.   Neuro/Psych Anxiety Depression    GI/Hepatic negative GI ROS, Neg liver ROS,   Endo/Other  Morbid obesity  Renal/GU negative Renal ROS     Musculoskeletal   Abdominal (+) + obese,   Peds  Hematology negative hematology ROS (+)   Anesthesia Other Findings   Reproductive/Obstetrics                           Lab Results  Component Value Date   WBC 10.9* 08/16/2015   HGB 11.3* 08/16/2015   HCT 34.2* 08/16/2015   MCV 76.9* 08/16/2015   PLT 178 08/16/2015   Lab Results  Component Value Date   CREATININE 0.50 08/16/2015   BUN 6 08/16/2015   NA 135 08/16/2015   K 3.9 08/16/2015   CL 107 08/16/2015   CO2 23 08/16/2015    Anesthesia Physical Anesthesia Plan  ASA: III  Anesthesia Plan: Combined Spinal and Epidural   Post-op Pain Management:    Induction:   Airway Management Planned: Natural Airway  Additional Equipment:   Intra-op Plan:   Post-operative Plan:   Informed Consent: I have reviewed the patients History and Physical, chart, labs and discussed the procedure including the risks, benefits and alternatives for the proposed anesthesia with the patient or authorized representative who has indicated his/her understanding and acceptance.     Plan Discussed with: CRNA  Anesthesia Plan Comments:         Anesthesia Quick Evaluation

## 2015-08-18 ENCOUNTER — Encounter (HOSPITAL_COMMUNITY): Payer: Self-pay | Admitting: Obstetrics and Gynecology

## 2015-08-18 LAB — CBC
HEMATOCRIT: 30.4 % — AB (ref 36.0–46.0)
HEMOGLOBIN: 10 g/dL — AB (ref 12.0–15.0)
MCH: 25.1 pg — ABNORMAL LOW (ref 26.0–34.0)
MCHC: 32.9 g/dL (ref 30.0–36.0)
MCV: 76.4 fL — ABNORMAL LOW (ref 78.0–100.0)
Platelets: 165 10*3/uL (ref 150–400)
RBC: 3.98 MIL/uL (ref 3.87–5.11)
RDW: 15.3 % (ref 11.5–15.5)
WBC: 13.4 10*3/uL — AB (ref 4.0–10.5)

## 2015-08-18 MED ORDER — RHO D IMMUNE GLOBULIN 1500 UNIT/2ML IJ SOSY
300.0000 ug | PREFILLED_SYRINGE | Freq: Once | INTRAMUSCULAR | Status: AC
  Administered 2015-08-18: 300 ug via INTRAVENOUS
  Filled 2015-08-18: qty 2

## 2015-08-18 NOTE — Progress Notes (Signed)
Labetalol d/c'ed due to low BP

## 2015-08-18 NOTE — Progress Notes (Signed)
CSW attempted to meet with MOB in order to complete psychosocial assessment due to infant's NICU admission and due to [facility] presenting with a history of anxiety, depression, and bipolar.  MOB was visiting in the NICU.    CSW to meet with MOB on 4/7.

## 2015-08-18 NOTE — Lactation Note (Signed)
This note was copied from a baby's chart. Lactation Consultation Note  Met with mom at infants bedside.  Mom states she is pumping every 3 hours followed by hand expression.  She is obtaining a few mls with hand expression.  Mom states she has a pump at home.  Encouraged to continue pumping/hand expression to establish milk supply.  To call with concerns/assist prn.  Patient Name: Suzanne Andrade TDVVO'H Date: 08/18/2015     Maternal Data    Feeding    LATCH Score/Interventions                      Lactation Tools Discussed/Used     Consult Status      Ave Filter 08/18/2015, 3:41 PM

## 2015-08-18 NOTE — Progress Notes (Signed)
Rhogam given Lot# [id number] Exp:11/03/17

## 2015-08-18 NOTE — Op Note (Signed)
NAMERICKAYLA, HODSON              ACCOUNT NO.:  [id number]  MEDICAL RECORD NO.:  [id number]  LOCATION:  [id number]                          FACILITY:  [facility]  PHYSICIAN:  Thornell Sartorius, MD        DATE OF BIRTH:  10/27/1975  DATE OF PROCEDURE:  08/17/2015 DATE OF DISCHARGE:                              OPERATIVE REPORT   PREOPERATIVE DIAGNOSES:  Intrauterine pregnancy at term, history of low- transverse cesarean section x3 for repeat low-transverse cesarean section.  POSTOPERATIVE DIAGNOSIS:  Intrauterine pregnancy at term, history of low- transverse cesarean section x3 for repeat low-transverse cesarean section, delivered.  PROCEDURE:  Repeat low-transverse cesarean section.  SURGEON:  Thornell Sartorius, MD  ASSISTANT:  Teryl Lucy, RNFA.  FINDINGS:  Viable female infant at 10:02 a.m. with Apgars of 6 at 1 minute and 8 at 5 minutes.  Weight of 7 pounds and 11 ounces.  Normal postpartum uterus, tubes, and ovaries.  Particular Meconium was noticed at delivery and baby went to the NICU with respiratory concerns.  ANESTHESIA:  Spinal.  IV FLUIDS:  2700 mL.  URINE OUTPUT:  150 mL.  EBL:  Approximately 800 mL.  PATHOLOGY:  Placenta to L and D.  COMPLICATIONS:  None.  DESCRIPTION OF PROCEDURE:  After informed consent was reviewed with the patient including risks, benefits, and alternatives of the surgical procedure, she was transported to the OR, where spinal epidural was placed and found to be adequate.  She was then returned to the supine position with a leftward tilt, prepped and draped in the normal sterile fashion.  Her catheter had been sterilely placed.  A Pfannenstiel incision was made at the level of the previous incision and carried through to the underlying layer of fascia sharply.  The fascia was then incised in the midline and the midline was extended laterally with Mayo scissors.  The superior aspect of the fascial incision was grasped with Kocher clamps and elevated.   Rectus muscles were dissected off both bluntly and sharply.  The midline was entered in the process.  This incision was extended superiorly and inferiorly with good visualization of the bladder.  The Alexis skin retractor was placed after some small uterine peritoneal incisions were lysed.  The uterus was incised in transverse fashion.  Infant was delivered easily from the vertex presentation.  Cord was awaited to be clamped for minute.  During this time, nose and mouth were suctioned and baby was stimulated, noted to cry, make respiratory attempts.  Cord was then clamped and cut.  Infant was handed off to the waiting NICU staff.  The placenta was expressed from the uterus and given to the cord blood collection.  The uterus was cleared of all clot and debris.  The uterine incision was closed in two layers of 0 Monocryl, the first of which was running lock and the second as an imbricating layer.  This was noted to be hemostatic.  The gutters were cleared of all clot and debris.  The peritoneum was then reapproximated using 2-0 Vicryl.  The fascia was closed with the 0 Vicryl in a running fashion.  The subcuticular adipose layer was made hemostatic with Bovie cautery and then using  a plain gut, the dead space was closed.  The skin was closed with 4-0 Vicryl on a Keith needle. Sponge, lap, and needle counts were correct x2 per the operating room staff.  Benzoin and Steri-Strips were applied.  The patient tolerated the procedure well. Also vaccuum bandage placed.       Thornell Sartorius, MD     JB/MEDQ  D:  08/17/2015  T:  08/18/2015  Job:  [id number]

## 2015-08-18 NOTE — Progress Notes (Signed)
Subjective: Postpartum Day 1: Cesarean Delivery Patient reports nausea, vomiting has improved, incisional pain, tolerating PO and no problems voiding.    Objective: Vital signs in last 24 hours: Temp:  [97.6 F (36.4 C)-98.3 F (36.8 C)] 98.2 F (36.8 C) (04/06 0853) Pulse Rate:  [56-110] 71 (04/06 0853) Resp:  [14-20] 20 (04/06 0853) BP: (114-154)/(43-87) 121/43 mmHg (04/06 0853) SpO2:  [93 %-100 %] 100 % (04/06 0853)  Physical Exam:  General: alert and no distress Lochia: appropriate Uterine Fundus: firm Incision: healing well DVT Evaluation: No evidence of DVT seen on physical exam.   Recent Labs  08/16/15 1105 08/18/15 0537  HGB 11.3* 10.0*  HCT 34.2* 30.4*    Assessment/Plan: Status post Cesarean section. Doing well postoperatively.  Continue current care.  Baby NICU - mec aspiration, jet vent  Bovard-Stuckert, Prisha Hiley 08/18/2015, 9:17 AM

## 2015-08-19 LAB — RH IG WORKUP (INCLUDES ABO/RH)
ABO/RH(D): O NEG
FETAL SCREEN: NEGATIVE
GESTATIONAL AGE(WKS): 39
UNIT DIVISION: 0

## 2015-08-19 MED ORDER — IBUPROFEN 800 MG PO TABS: 800.0000 mg | ORAL_TABLET | Freq: Three times a day (TID) | ORAL | Status: DC

## 2015-08-19 MED ORDER — PRENATAL PLUS 27-1 MG PO TABS: 1.0000 | ORAL_TABLET | Freq: Every day | ORAL | Status: DC

## 2015-08-19 MED ORDER — OXYCODONE-ACETAMINOPHEN 5-325 MG PO TABS: 1.0000 | ORAL_TABLET | Freq: Four times a day (QID) | ORAL | Status: DC | PRN

## 2015-08-19 NOTE — Discharge Summary (Addendum)
OB Discharge Summary     Patient Name: Suzanne Andrade DOB: 1975-09-23 MRN: [id number]  Date of admission: 08/17/2015 Delivering MD: Janyth Contes   Date of discharge: 08/19/2015  Admitting diagnosis: Repeat C-Section Intrauterine pregnancy: [redacted]w[redacted]d     Secondary diagnosis:  Active Problems:   S/P cesarean section  Additional problems: AMA, CHTN     Discharge diagnosis: Term Pregnancy Delivered                                                                                                Post partum procedures:Rhogam  Augmentation: N/A  Complications: None  Hospital course:  Sceduled C/S   40 y.o. yo [id number] at [redacted]w[redacted]d was admitted to the hospital 08/17/2015 for scheduled cesarean section with the following indication:Elective Repeat.  Membrane Rupture Time/Date: 10:01 AM ,08/17/2015   Patient delivered a Viable infant.08/17/2015  Details of operation can be found in separate operative note.  Pateint had an uncomplicated postpartum course.  She is ambulating, tolerating a regular diet, passing flatus, and urinating well. Patient is discharged home in stable condition on  08/19/2015          Physical exam  Filed Vitals:   08/18/15 1000 08/18/15 1140 08/18/15 1552 08/18/15 1826  BP: 113/60 113/60 123/63 128/57  Pulse: 67 67 74 75  Temp:    98.2 F (36.8 C)  TempSrc:    Oral  Resp:    20  SpO2:       General: alert and no distress Lochia: appropriate Uterine Fundus: firm Incision: Healing well with no significant drainage DVT Evaluation: No evidence of DVT seen on physical exam. Labs: Lab Results  Component Value Date   WBC 13.4* 08/18/2015   HGB 10.0* 08/18/2015   HCT 30.4* 08/18/2015   MCV 76.4* 08/18/2015   PLT 165 08/18/2015   CMP Latest Ref Rng 08/16/2015  Glucose 65 - 99 mg/dL 112(H)  BUN 6 - 20 mg/dL 6  Creatinine 0.44 - 1.00 mg/dL 0.50  Sodium 135 - 145 mmol/L 135  Potassium 3.5 - 5.1 mmol/L 3.9  Chloride 101 - 111 mmol/L 107  CO2 22 - 32 mmol/L 23   Calcium 8.9 - 10.3 mg/dL 9.1    Discharge instruction: per After Visit Summary and "Baby and Me Booklet".  After visit meds:    Medication List    STOP taking these medications        labetalol 200 MG tablet  Commonly known as:  NORMODYNE      TAKE these medications        ferrous sulfate 325 (65 FE) MG tablet  Take 325 mg by mouth daily with breakfast.     FISH OIL EXTRA STRENGTH PO  Take 1 tablet by mouth daily.     ibuprofen 800 MG tablet  Commonly known as:  ADVIL,MOTRIN  Take 1 tablet (800 mg total) by mouth every 8 (eight) hours.     oxyCODONE-acetaminophen 5-325 MG tablet  Commonly known as:  PERCOCET/ROXICET  Take 1-2 tablets by mouth every 6 (six) hours as needed (pain scale 4-7).     prenatal vitamin  w/FE, FA 27-1 MG Tabs tablet  Take 1 tablet by mouth daily.        Diet: routine diet  Activity: Advance as tolerated. Pelvic rest for 6 weeks.   Outpatient follow up:1 week for bp check; 2 weeks for incision check and 6 weeks for postpartum visit Follow up Appt:No future appointments. Follow up Visit:No Follow-up on file.  Postpartum contraception: Undecided  Newborn Data: Live born female  Birth Weight: 7 lb 11.1 oz (3490 g) APGAR: 6, 8  Baby Feeding: Bottle and Breast Disposition:NICU   Elijah  08/19/2015 Janyth Contes, MD

## 2015-08-19 NOTE — Clinical Social Work Maternal (Signed)
CLINICAL SOCIAL WORK MATERNAL/CHILD NOTE  Patient Details  Name: Suzanne Andrade MRN: [id number] Date of Birth: 12/25/1975  Date:  08/19/2015  Clinical Social Worker Initiating Note:  Seibert Keeter E. Finesse Fielder, LCSW Date/ Time Initiated:  08/19/15/1030     Child's Name:  Suzanne Andrade   Legal Guardian:   (Parents: Suzanne Andrade and Suzanne Andrade)   Need for Interpreter:  None   Date of Referral:        Reason for Referral:   (No referral-NICU admission.)   Referral Source:      Address:  [address], [place], [place] [address]  Phone number:  [phone]   Household Members:  Minor Children, Significant Other (MOB has 3 other children living in the home: daughter age 18, daughter age 15, and son age 19 months.  She has two older children, daughter age 23 and son age 21 who no longer live at home.)   Natural Supports (not living in the home):  Spouse/significant other, Parent, Children   Professional Supports: Therapist, Other (Comment) (MOB has a Psychiatrist and a Counselor at [facility])   Employment:     Type of Work:     Education:      Financial Resources:  Medicaid   Other Resources:      Cultural/Religious Considerations Which May Impact Care: None stated.  Strengths:  Ability to meet basic needs , Compliance with medical plan , Home prepared for child , Understanding of illness   Risk Factors/Current Problems:  Mental Health Concerns  (Hx of Anxiety and Bipolar.)   Cognitive State:  Alert , Able to Concentrate , Linear Thinking , Goal Oriented , Insightful    Mood/Affect:  Interested , Euthymic , Relaxed    CSW Assessment: CSW met with MOB in her first floor room/125 to introduce services, offer support, and complete assessment due to baby's admission to NICU.  CSW notes a documented history of Dep/Anx.  MOB was pleasant and welcoming and easily engaged in conversation. She reports baby is progressing and seems to have a good understanding of  his medical condition at this time.  She reports feeling comfortable with the staff and care baby is receiving and that she has been updated frequently.  MOB reports that she was in pain from inconsistent contractions for the past two weeks and worried that it would effect baby.  She states she had an intuition that something was wrong.  She feels, however, that she is coping well and "better than I thought after everything happened (at delivery)."  She states she is "playing the waiting game" and understands that there is no way to predict how long baby's hospitalization will be.  CSW encouraged her to take this experience one day at a time without placing expectations on baby regarding discharge in order to hopefully lessen let down.  MOB states understanding and seems to be in a good frame of mind.   CSW asked about her delivery and she was open to telling her story.  She states she "knew something was wrong" because the baby was not brought right to her.  She reports being "in and out of it" and feeling very nauseous.  She states FOB left with baby and then she could tell by looking at his face that he was not happy, but looked sad.  She states he does not talk about his emotions, but she can tell he is worried about baby.  She reports that they are "opposites" as he is   laid back and quiet, and, "I don't have any patience."  This led to a discussion about her emotional health.  She reports feeling better emotionally during this pregnancy than any of her others.  She states she has Anxiety, ADHD, and Bipolar and that she is followed by staff at [facility].  She states that she stopped taking Adderall and Lamictal with pregnancy and feels she would like to try to stay off this medication by using a birth control pill "that will keep the hormones high" and mock pregnancy.  She states she has spoken with her doctor about this.  She states she can call her Psychiatrist or Counselor any time if she feels this  is not working and states the benefits of mental health treatment.  She reports no history of perinatal mood disorders and was attentive as CSW spoke about signs and symptoms to watch for as well as the importance of calling her doctor if she has concerns at any time.  MOB agreed.  She reports that she knows herself and her symptoms well and is not afraid to ask for help when she needs it.  CSW commends her for this.   MOB reports that she has a good support system, although she is a private person.  She states she has a strong personality and has already told family and friends that she will not be providing information about baby's hospitalization as she needs this time to just be a family.  She reports that FOB (also father of her 19 months old), her 23 year old daughter, and her mother are her greatest support people.  She states they are caring for her 19 month old while she is in the hospital.  She also reports having all needed baby supplies for infant at home.   CSW explained ongoing support services offered by NICU CSW while baby is hospitalized and gave contact information.  MOB was very appreciative and thanked CSW for the visit.  CSW Plan/Description:  Patient/Family Education , Psychosocial Support and Ongoing Assessment of Needs    Suzanne Mkrtchyan Elizabeth, LCSW 08/19/2015, 1:30 PM 

## 2015-08-19 NOTE — Lactation Note (Signed)
This note was copied from a baby's chart. Lactation Consultation Note  Patient Name: Suzanne Andrade [id number] Date: 08/19/2015 Reason for consult: Follow-up assessment  NICU baby 67 hours old. Mom pumped about an hour earlier and was able to pump about 5 ml of colostrum. Mom states that she is pumping 8 times/24 hours for 15 minutes followed by hand expression. Mom is aware of normal progression of milk coming to volume, how to transport EBM back to hospital from home, and pumping rooms in NICU. Mom has larger bottles with lids in room. Discussed benefits of STS and having pictures of baby on phone to view while pumping. Enc mom to call for assistance as needed.  Maternal Data    Feeding    LATCH Score/Interventions                      Lactation Tools Discussed/Used     Consult Status Consult Status: Follow-up Date: 08/20/15 Follow-up type: In-patient    Inocente Salles 08/19/2015, 11:40 AM

## 2015-08-19 NOTE — Progress Notes (Addendum)
Subjective: Postpartum Day 2: Cesarean Delivery Patient reports incisional pain, tolerating PO and no problems voiding.    Objective: Vital signs in last 24 hours: Temp:  [98.2 F (36.8 C)] 98.2 F (36.8 C) (04/06 1826) Pulse Rate:  [67-75] 75 (04/06 1826) Resp:  [20] 20 (04/06 1826) BP: (113-128)/(43-63) 128/57 mmHg (04/06 1826) SpO2:  [100 %] 100 % (04/06 0853)  Physical Exam:  General: alert and no distress Lochia: appropriate Uterine Fundus: firm Incision: healing well DVT Evaluation: No evidence of DVT seen on physical exam.   Recent Labs  08/16/15 1105 08/18/15 0537  HGB 11.3* 10.0*  HCT 34.2* 30.4*    Assessment/Plan: Status post Cesarean section. Doing well postoperatively.  Continue current care.  Andrade, Suzanne Terpstra 08/19/2015, 8:15 AM  May desire d/c later today.

## 2015-08-20 LAB — TYPE AND SCREEN
ABO/RH(D): O NEG
Antibody Screen: NEGATIVE
UNIT DIVISION: 0
Unit division: 0

## 2015-08-20 NOTE — Progress Notes (Addendum)
Patient ID: Suzanne Andrade, female   DOB: 03-Mar-1976, 40 y.o.   MRN: [id number]  Pt doing well. Pain well controlled with meds. Ambulating and tolerating diet well. +Voids. Denies headaches, CP or SOB. Seen in NICU by baby. Denies discharge home today VSS ABD- soft, ND EXT- +edema, no Homans  A/P: POD #2 s/p c/s- stable        Reviewed discharge instructions        F/u in 1 week for bp check; 2 weeks for incision check and 6 weeks for postpartum visit

## 2015-08-20 NOTE — Discharge Summary (Signed)
OB Discharge Summary     Patient Name: Suzanne Andrade DOB: 14-Apr-1976 MRN: [id number]  Date of admission: 08/17/2015 Delivering MD: Janyth Contes   Date of discharge: 08/20/2015  Admitting diagnosis: Repeat C-Section Intrauterine pregnancy: [redacted]w[redacted]d     Secondary diagnosis:  Active Problems:   S/P cesarean section  Additional problems: hx chronic hypertension - now controlled without medication     Discharge diagnosis: Term Pregnancy Delivered and CHTN  - stable                                                                                            Post partum procedures:none  Augmentation: n/a  Complications: None  Hospital course:  Sceduled C/S   40 y.o. yo [id number] at [redacted]w[redacted]d was admitted to the hospital 08/17/2015 for scheduled cesarean section with the following indication:Elective Repeat.  Membrane Rupture Time/Date: 10:01 AM ,08/17/2015   Patient delivered a Viable infant.08/17/2015  Details of operation can be found in separate operative note.  Pateint had an uncomplicated postpartum course.  She is ambulating, tolerating a regular diet, passing flatus, and urinating well. Patient is discharged home in stable condition on  08/21/2015          Physical exam  Filed Vitals:   08/19/15 0906 08/19/15 1701 08/20/15 0612 08/20/15 0910  BP: 135/75 128/56 135/77 121/40  Pulse: 80 75 70 83  Temp:  98.5 F (36.9 C) 98.4 F (36.9 C) 98.1 F (36.7 C)  TempSrc: Oral Oral Oral Oral  Resp: 18 18 18 18   SpO2:   100% 100%   General: alert, cooperative and no distress Lochia: appropriate Uterine Fundus: firm Incision: Dressing is clean, dry, and intact DVT Evaluation: No evidence of DVT seen on physical exam. Labs: Lab Results  Component Value Date   WBC 13.4* 08/18/2015   HGB 10.0* 08/18/2015   HCT 30.4* 08/18/2015   MCV 76.4* 08/18/2015   PLT 165 08/18/2015   CMP Latest Ref Rng 08/16/2015  Glucose 65 - 99 mg/dL 112(H)  BUN 6 - 20 mg/dL 6  Creatinine 0.44 - 1.00 mg/dL  0.50  Sodium 135 - 145 mmol/L 135  Potassium 3.5 - 5.1 mmol/L 3.9  Chloride 101 - 111 mmol/L 107  CO2 22 - 32 mmol/L 23  Calcium 8.9 - 10.3 mg/dL 9.1    Discharge instruction: per After Visit Summary and "Baby and Me Booklet".  After visit meds:    Medication List    STOP taking these medications        labetalol 200 MG tablet  Commonly known as:  NORMODYNE      TAKE these medications        ferrous sulfate 325 (65 FE) MG tablet  Take 325 mg by mouth daily with breakfast.     FISH OIL EXTRA STRENGTH PO  Take 1 tablet by mouth daily.     ibuprofen 800 MG tablet  Commonly known as:  ADVIL,MOTRIN  Take 1 tablet (800 mg total) by mouth every 8 (eight) hours.     oxyCODONE-acetaminophen 5-325 MG tablet  Commonly known as:  PERCOCET/ROXICET  Take 1-2 tablets by mouth every  6 (six) hours as needed (pain scale 4-7).     prenatal vitamin w/FE, FA 27-1 MG Tabs tablet  Take 1 tablet by mouth daily.        Diet: low salt diet  Activity: Advance as tolerated. Pelvic rest for 6 weeks.   Outpatient follow up:1 week for bp check and 2 weeks for incision check and 6weeks for post partum visit Follow up Appt:No future appointments. Follow up Visit:No Follow-up on file.  Postpartum contraception: Undecided  Newborn Data: Live born female  Birth Weight: 7 lb 11.1 oz (3490 g) APGAR: 6, 8  Baby Feeding: Bottle and Breast Disposition:NICU   08/20/2015 Suzanne Quin Myesha Stillion, DO

## 2015-08-21 ENCOUNTER — Ambulatory Visit: Payer: Self-pay

## 2015-08-21 NOTE — Lactation Note (Signed)
This note was copied from a baby's chart. Lactation Consultation Note  Answered mom's questions regarding medications and foods choices during lactation. Assisted her with pump rental and sent fax to [facility]. Patient Name: Suzanne Andrade [id number] Date: 08/21/2015     Maternal Data    Feeding    LATCH Score/Interventions                      Lactation Tools Discussed/Used     Consult Status      [name] 08/21/2015, 9:11 AM

## 2015-08-22 ENCOUNTER — Ambulatory Visit: Payer: Self-pay

## 2015-08-22 NOTE — Lactation Note (Signed)
This note was copied from a baby's chart. Lactation Consultation Note  Follow up note.  Mom is pumping every 3 hours and obtaining 40-60 mls of transitional milk.  Praised mom for her efforts and answered questions.  Reassured mom that once baby goes to breast exclusively it will be easier.  Encouraged to call with concerns/assist.  Patient Name: Suzanne Andrade [id number] Date: 08/22/2015     Maternal Data    Feeding Feeding Type: Breast Milk Length of feed: 30 min  LATCH Score/Interventions                      Lactation Tools Discussed/Used     Consult Status      Ave Filter 08/22/2015, 3:49 PM

## 2015-08-24 ENCOUNTER — Encounter (HOSPITAL_COMMUNITY): Payer: Self-pay | Admitting: *Deleted

## 2015-08-24 ENCOUNTER — Inpatient Hospital Stay (HOSPITAL_COMMUNITY): Payer: Medicaid Other

## 2015-08-24 ENCOUNTER — Other Ambulatory Visit: Payer: Self-pay

## 2015-08-24 ENCOUNTER — Observation Stay (HOSPITAL_COMMUNITY)
Admission: AD | Admit: 2015-08-24 | Discharge: 2015-08-25 | Disposition: A | Discharge status: Home / Self Care | Payer: Medicaid Other | Source: Ambulatory Visit | Attending: Obstetrics and Gynecology | Admitting: Obstetrics and Gynecology

## 2015-08-24 DIAGNOSIS — Z86018 Personal history of other benign neoplasm: Secondary | ICD-10-CM | POA: Diagnosis not present

## 2015-08-24 DIAGNOSIS — O9089 Other complications of the puerperium, not elsewhere classified: Secondary | ICD-10-CM | POA: Diagnosis present

## 2015-08-24 DIAGNOSIS — O1495 Unspecified pre-eclampsia, complicating the puerperium: Principal | ICD-10-CM | POA: Insufficient documentation

## 2015-08-24 DIAGNOSIS — Z9889 Other specified postprocedural states: Secondary | ICD-10-CM | POA: Insufficient documentation

## 2015-08-24 DIAGNOSIS — Z79899 Other long term (current) drug therapy: Secondary | ICD-10-CM | POA: Diagnosis not present

## 2015-08-24 DIAGNOSIS — M549 Dorsalgia, unspecified: Secondary | ICD-10-CM | POA: Insufficient documentation

## 2015-08-24 DIAGNOSIS — R0602 Shortness of breath: Secondary | ICD-10-CM | POA: Diagnosis not present

## 2015-08-24 DIAGNOSIS — Z8619 Personal history of other infectious and parasitic diseases: Secondary | ICD-10-CM | POA: Insufficient documentation

## 2015-08-24 DIAGNOSIS — R001 Bradycardia, unspecified: Secondary | ICD-10-CM | POA: Insufficient documentation

## 2015-08-24 DIAGNOSIS — Z8659 Personal history of other mental and behavioral disorders: Secondary | ICD-10-CM | POA: Insufficient documentation

## 2015-08-24 LAB — COMPREHENSIVE METABOLIC PANEL
ALT: 65 U/L — ABNORMAL HIGH (ref 14–54)
ANION GAP: 6 (ref 5–15)
AST: 63 U/L — ABNORMAL HIGH (ref 15–41)
Albumin: 3 g/dL — ABNORMAL LOW (ref 3.5–5.0)
Alkaline Phosphatase: 85 U/L (ref 38–126)
BUN: 12 mg/dL (ref 6–20)
CHLORIDE: 109 mmol/L (ref 101–111)
CO2: 25 mmol/L (ref 22–32)
Calcium: 8.9 mg/dL (ref 8.9–10.3)
Creatinine, Ser: 0.64 mg/dL (ref 0.44–1.00)
Glucose, Bld: 106 mg/dL — ABNORMAL HIGH (ref 65–99)
POTASSIUM: 4.1 mmol/L (ref 3.5–5.1)
SODIUM: 140 mmol/L (ref 135–145)
Total Bilirubin: 0.3 mg/dL (ref 0.3–1.2)
Total Protein: 6.2 g/dL — ABNORMAL LOW (ref 6.5–8.1)

## 2015-08-24 LAB — PROTEIN / CREATININE RATIO, URINE
CREATININE, URINE: 129 mg/dL
PROTEIN CREATININE RATIO: 0.09 mg/mg{creat} (ref 0.00–0.15)
TOTAL PROTEIN, URINE: 11 mg/dL

## 2015-08-24 LAB — CBC
HCT: 31.3 % — ABNORMAL LOW (ref 36.0–46.0)
Hemoglobin: 10.3 g/dL — ABNORMAL LOW (ref 12.0–15.0)
MCH: 25.2 pg — AB (ref 26.0–34.0)
MCHC: 32.9 g/dL (ref 30.0–36.0)
MCV: 76.7 fL — AB (ref 78.0–100.0)
PLATELETS: 218 10*3/uL (ref 150–400)
RBC: 4.08 MIL/uL (ref 3.87–5.11)
RDW: 14.9 % (ref 11.5–15.5)
WBC: 8.1 10*3/uL (ref 4.0–10.5)

## 2015-08-24 LAB — D-DIMER, QUANTITATIVE: D-Dimer, Quant: 3.89 ug/mL-FEU — ABNORMAL HIGH (ref 0.00–0.50)

## 2015-08-24 MED ORDER — ACETAMINOPHEN 500 MG PO TABS
1000.0000 mg | ORAL_TABLET | Freq: Four times a day (QID) | ORAL | Status: DC | PRN
  Administered 2015-08-24 – 2015-08-25 (×3): 1000 mg via ORAL
  Filled 2015-08-24 (×3): qty 2

## 2015-08-24 MED ORDER — IOPAMIDOL (ISOVUE-370) INJECTION 76%
100.0000 mL | Freq: Once | INTRAVENOUS | Status: AC | PRN
  Administered 2015-08-24: 100 mL via INTRAVENOUS

## 2015-08-24 MED ORDER — MAGNESIUM SULFATE BOLUS VIA INFUSION
6.0000 g | Freq: Once | INTRAVENOUS | Status: AC
  Administered 2015-08-24: 6 g via INTRAVENOUS
  Filled 2015-08-24: qty 500

## 2015-08-24 MED ORDER — LABETALOL HCL 200 MG PO TABS
200.0000 mg | ORAL_TABLET | Freq: Two times a day (BID) | ORAL | Status: DC
  Administered 2015-08-24 – 2015-08-25 (×4): 200 mg via ORAL
  Filled 2015-08-24 (×4): qty 1

## 2015-08-24 MED ORDER — HYDRALAZINE HCL 20 MG/ML IJ SOLN: 5.0000 mg | INTRAMUSCULAR | Status: DC | PRN

## 2015-08-24 MED ORDER — MAGNESIUM SULFATE 50 % IJ SOLN
2.0000 g/h | INTRAVENOUS | Status: DC
  Administered 2015-08-24 – 2015-08-25 (×2): 2 g/h via INTRAVENOUS
  Filled 2015-08-24 (×2): qty 80

## 2015-08-24 MED ORDER — LACTATED RINGERS IV SOLN
INTRAVENOUS | Status: DC
  Administered 2015-08-24 – 2015-08-25 (×3): via INTRAVENOUS

## 2015-08-24 MED ORDER — LABETALOL HCL 5 MG/ML IV SOLN: 20.0000 mg | INTRAVENOUS | Status: DC | PRN

## 2015-08-24 MED ORDER — OXYCODONE HCL 5 MG PO TABS
5.0000 mg | ORAL_TABLET | ORAL | Status: DC | PRN
  Administered 2015-08-24 – 2015-08-25 (×3): 5 mg via ORAL
  Filled 2015-08-24 (×3): qty 1

## 2015-08-24 MED ORDER — FUROSEMIDE 10 MG/ML IJ SOLN
20.0000 mg | Freq: Once | INTRAMUSCULAR | Status: AC
  Administered 2015-08-24: 20 mg via INTRAVENOUS
  Filled 2015-08-24: qty 2

## 2015-08-24 MED ORDER — HYDRALAZINE HCL 20 MG/ML IJ SOLN
5.0000 mg | INTRAMUSCULAR | Status: AC | PRN
  Administered 2015-08-24: 10 mg via INTRAVENOUS
  Administered 2015-08-24: 5 mg via INTRAVENOUS
  Filled 2015-08-24: qty 1

## 2015-08-24 MED ORDER — LACTATED RINGERS IV SOLN
INTRAVENOUS | Status: DC
  Administered 2015-08-24: 08:00:00 via INTRAVENOUS

## 2015-08-24 MED ORDER — FUROSEMIDE 20 MG PO TABS
20.0000 mg | ORAL_TABLET | Freq: Two times a day (BID) | ORAL | Status: DC
  Administered 2015-08-24 – 2015-08-25 (×2): 20 mg via ORAL
  Filled 2015-08-24 (×2): qty 1

## 2015-08-24 NOTE — Lactation Note (Signed)
Lactation Consultation Note  Visit made to mom who was readmitted today for treatment of hypertension.  She is 7 days postpartum and her baby is in the NICU.  Mom has been pumping for baby and supply is increasing.  Instructed to pump every 3 hours for 15 minutes or longer if milk is still flowing.  No breast complaints.  Mom has been set up with [employer] pump.  Encouraged to call with concerns.  Patient Name: Suzanne Andrade [id number] Date: 08/24/2015     Maternal Data    Feeding    LATCH Score/Interventions                      Lactation Tools Discussed/Used     Consult Status      Ave Filter 08/24/2015, 2:21 PM

## 2015-08-24 NOTE — MAU Provider Note (Signed)
History     CSN: [id number]  Arrival date and time: 08/24/15 [id number]   First Provider Initiated Contact with Patient 08/24/15 [phone]       Chief Complaint  Patient presents with  . Shortness of Breath   HPI Suzanne Andrade is a 40 y.o. [id number] who presents 1 week s/p RCS with shortness of breath. Reports increased SOB since yesterday. Was worse with activity. Last night had trouble sleeping; had to sleep sitting up. "just feels like something is wrong". Denies headache, vision changes, epigastric pain, cough, or fever. Denies chest pain. States has discomfort around sides & back at her ribs.  History of hypertension, states they took her off her meds.   OB History    Gravida Para Term Preterm AB TAB SAB Ectopic Multiple Living   7 6 6  1 1    0 6      Past Medical History  Diagnosis Date  . Hypertension   . Anxiety   . Headache(784.0)   . Fibroid   . Vaginal Pap smear, abnormal     ok since  . S/P cesarean section 01/05/2014  . Hx of varicella   . Depression     stopped meds with pregnancy    Past Surgical History  Procedure Laterality Date  . Cesarean section    . Cyst removal hand Right 1992  . Wisdom tooth extraction Bilateral 2007  . Induced abortion    . Dilation and curettage of uterus    . Cesarean section N/A 01/05/2014    Procedure: REPEAT CESAREAN SECTION;  Surgeon: Janyth Contes, MD;  Location: [facility];  Service: Obstetrics;  Laterality: N/A;  . Cesarean section N/A 08/17/2015    Procedure: CESAREAN SECTION;  Surgeon: Janyth Contes, MD;  Location: [facility];  Service: Obstetrics;  Laterality: N/A;    Family History  Problem Relation Age of Onset  . Mental illness Mother   . Hypertension Mother   . GER disease Mother   . Hypertension Father   . Diabetes Father   . Mental illness Maternal Aunt   . Mental illness Maternal Uncle   . Diabetes Maternal Grandmother     Social History  Substance Use Topics  . Smoking status: Never Smoker   .  Smokeless tobacco: Never Used  . Alcohol Use: No    Allergies: No Known Allergies  Prescriptions prior to admission  Medication Sig Dispense Refill Last Dose  . ferrous sulfate 325 (65 FE) MG tablet Take 325 mg by mouth daily with breakfast.   08/23/2015 at Unknown time  . ibuprofen (ADVIL,MOTRIN) 800 MG tablet Take 1 tablet (800 mg total) by mouth every 8 (eight) hours. 45 tablet 0 08/24/2015 at Unknown time  . Omega-3 Fatty Acids (FISH OIL EXTRA STRENGTH PO) Take 1 tablet by mouth daily.   08/23/2015 at Unknown time  . oxyCODONE-acetaminophen (PERCOCET/ROXICET) 5-325 MG tablet Take 1-2 tablets by mouth every 6 (six) hours as needed (pain scale 4-7). 30 tablet 0 Past Week at Unknown time  . prenatal vitamin w/FE, FA (PRENATAL 1 + 1) 27-1 MG TABS tablet Take 1 tablet by mouth daily. 100 each 3 08/23/2015 at Unknown time    Review of Systems  Constitutional: Negative.   Eyes: Negative for blurred vision.  Respiratory: Positive for shortness of breath. Negative for cough, sputum production and wheezing.   Cardiovascular: Positive for orthopnea and leg swelling. Negative for chest pain and palpitations.  Gastrointestinal: Negative.   Musculoskeletal: Positive for back pain.  Neurological: Negative  for dizziness, loss of consciousness and headaches.   Physical Exam   Blood pressure 182/72, pulse 49, temperature 98.6 F (37 C), temperature source Oral, resp. rate 22, SpO2 100 %, currently breastfeeding.  Patient Vitals for the past 24 hrs:  BP Temp Temp src Pulse Resp SpO2  08/24/15 0904 166/82 mmHg - - 68 - 99 %  08/24/15 0849 162/91 mmHg - - 63 - 98 %  08/24/15 0848 - - - 60 - -  08/24/15 0846 179/83 mmHg - - - - -  08/24/15 0844 - - - (!) 58 - 100 %  08/24/15 0840 179/83 mmHg - - 61 - -  08/24/15 0806 193/97 mmHg - - (!) 51 - -  08/24/15 0804 (!) 186/114 mmHg - - (!) 48 - 100 %  08/24/15 0754 197/95 mmHg - - (!) 48 - 100 %  08/24/15 0715 156/88 mmHg - - (!) 50 - 100 %  08/24/15  0707 182/72 mmHg 98.6 F (37 C) Oral (!) 49 22 100 %    Physical Exam  Nursing note and vitals reviewed. Constitutional: She is oriented to person, place, and time. She appears well-developed and well-nourished. No distress.  HENT:  Head: Normocephalic and atraumatic.  Eyes: Conjunctivae are normal. Right eye exhibits no discharge. Left eye exhibits no discharge. No scleral icterus.  Neck: Normal range of motion.  Cardiovascular: Regular rhythm and normal heart sounds.  Bradycardia present.   No murmur heard. Respiratory: Tachypnea noted. She is in respiratory distress. She has decreased breath sounds in the right lower field and the left lower field. She has no wheezes. She has no rhonchi. She has no rales.  Musculoskeletal: She exhibits edema (BLE).  Neurological: She is alert and oriented to person, place, and time. She has normal reflexes.  No clonus  Skin: Skin is warm and dry. She is not diaphoretic.  Psychiatric: She has a normal mood and affect. Her behavior is normal. Judgment and thought content normal.    MAU Course  Procedures  MDM EKG - sinus bradycardia Ordered preE protocol with hydral for severe range BP 0740- Initially s/w Dr. Terri Piedra regarding patient. Informed of EKG, VS, presentation, and labs ordered. [place] with CT order to r/o PE. Will call Dr. Willis Modena with results.  Pt to CT Care turned over to D Poe CNM     Jorje Guild, NP 08/24/2015 8:21 AM   Results for orders placed or performed during the hospital encounter of 08/24/15 (from the past 24 hour(s))  CBC     Status: Abnormal   Collection Time: 08/24/15  8:05 AM  Result Value Ref Range   WBC 8.1 4.0 - 10.5 K/uL   RBC 4.08 3.87 - 5.11 MIL/uL   Hemoglobin 10.3 (L) 12.0 - 15.0 g/dL   HCT 31.3 (L) 36.0 - 46.0 %   MCV 76.7 (L) 78.0 - 100.0 fL   MCH 25.2 (L) 26.0 - 34.0 pg   MCHC 32.9 30.0 - 36.0 g/dL   RDW 14.9 11.5 - 15.5 %   Platelets 218 150 - 400 K/uL  Comprehensive metabolic panel     Status: Abnormal    Collection Time: 08/24/15  8:05 AM  Result Value Ref Range   Sodium 140 135 - 145 mmol/L   Potassium 4.1 3.5 - 5.1 mmol/L   Chloride 109 101 - 111 mmol/L   CO2 25 22 - 32 mmol/L   Glucose, Bld 106 (H) 65 - 99 mg/dL   BUN 12 6 - 20 mg/dL  Creatinine, Ser 0.64 0.44 - 1.00 mg/dL   Calcium 8.9 8.9 - 10.3 mg/dL   Total Protein 6.2 (L) 6.5 - 8.1 g/dL   Albumin 3.0 (L) 3.5 - 5.0 g/dL   AST 63 (H) 15 - 41 U/L   ALT 65 (H) 14 - 54 U/L   Alkaline Phosphatase 85 38 - 126 U/L   Total Bilirubin 0.3 0.3 - 1.2 mg/dL   GFR calc non Af Amer >60 >60 mL/min   GFR calc Af Amer >60 >60 mL/min   Anion gap 6 5 - 15  D-dimer, quantitative (not at [facility])     Status: Abnormal   Collection Time: 08/24/15  8:05 AM  Result Value Ref Range   D-Dimer, Quant 3.89 (H) 0.00 - 0.50 ug/mL-FEU  Ct Angio Chest Pe W/cm &/or Wo Cm  08/24/2015  CLINICAL DATA:  Shortness of breath. Recent Cesarean section on 08/17/2015. EXAM: CT ANGIOGRAPHY CHEST WITH CONTRAST TECHNIQUE: Multidetector CT imaging of the chest was performed using the standard protocol during bolus administration of intravenous contrast. Multiplanar CT image reconstructions and MIPs were obtained to evaluate the vascular anatomy. CONTRAST:  100 mL Isovue 370 IV COMPARISON:  None. FINDINGS: Mediastinum/Nodes: The pulmonary arteries are well opacified. There is no evidence of pulmonary embolism. The thoracic aorta is normal in caliber. The heart size is at the upper limits of normal. No evidence of pericardial effusion. No lymphadenopathy. Lungs/Pleura: Lungs show distention of pulmonary veins and evidence of interstitial edema with some patchy areas of alveolar edema present. There are associated tiny bilateral pleural effusions. No focal airspace consolidation, nodule or pneumothorax. Upper abdomen: Visualized upper abdomen shows diffuse hepatic steatosis. Musculoskeletal: Bony structures are normal. Review of the MIP images confirms the above findings. IMPRESSION:  1. No evidence of pulmonary embolism. 2. Pulmonary interstitial edema with some patchy areas of alveolar edema and tiny bilateral pleural effusions. 3. Hepatic steatosis. Electronically Signed   By: Aletta Edouard M.D.   On: 08/24/2015 08:47  Hydralazine given x2 Addendum: gradual onset DOE yesterday, worse when supine, persisting this am. Generalized edema worsening.  Had been on antihypertensives prepregnancy and labatelol in pregnancy. Infant stable in NICU with MAS. Pumping breasts. 3+ pretibial pitting edema C/W Dr. Willis Modena who will admit pt. Lasix 20 mg IV now.    Assessment and Plan  40 yo [id number] POD#6 [facility] with superimposed severe preeclampsia  Lorene Dy, CNM 08/24/2015 9:17 AM

## 2015-08-24 NOTE — MAU Note (Signed)
R c/s on 4/5.   Got sob yesterday when walking out to car, felt tired.  During the night felt like she couldn't get her breath.  Is better when she sits up. (pt is breathing fast and seems somewhat labored, but continues talking)

## 2015-08-24 NOTE — H&P (Signed)
Chief Complaint  Patient presents with  . Shortness of Breath   HPI Suzanne Andrade is a 40 y.o. [id number] who presents 1 week s/p RCS with shortness of breath. Reports increased SOB since yesterday. Was worse with activity. Last night had trouble sleeping; had to sleep sitting up. "just feels like something is wrong". Denies headache, vision changes, epigastric pain, cough, or fever. Denies chest pain. States has discomfort around sides & back at her ribs.  History of hypertension, states they took her off her meds.   OB History    Gravida Para Term Preterm AB TAB SAB Ectopic Multiple Living   7 6 6  1 1    0 6      Past Medical History  Diagnosis Date  . Hypertension   . Anxiety   . Headache(784.0)   . Fibroid   . Vaginal Pap smear, abnormal     ok since  . S/P cesarean section 01/05/2014  . Hx of varicella   . Depression     stopped meds with pregnancy    Past Surgical History  Procedure Laterality Date  . Cesarean section    . Cyst removal hand Right 1992  . Wisdom tooth extraction Bilateral 2007  . Induced abortion    . Dilation and curettage of uterus    . Cesarean section N/A 01/05/2014    Procedure: REPEAT CESAREAN SECTION; Surgeon: Janyth Contes, MD; Location: [facility]; Service: Obstetrics; Laterality: N/A;  . Cesarean section N/A 08/17/2015    Procedure: CESAREAN SECTION; Surgeon: Janyth Contes, MD; Location: [facility]; Service: Obstetrics; Laterality: N/A;    Family History  Problem Relation Age of Onset  . Mental illness Mother   . Hypertension Mother   . GER disease Mother   . Hypertension Father   . Diabetes Father   . Mental illness Maternal Aunt   . Mental illness Maternal Uncle   . Diabetes Maternal Grandmother     Social History  Substance Use Topics  . Smoking status: Never Smoker    . Smokeless tobacco: Never Used  . Alcohol Use: No    Allergies: No Known Allergies  Prescriptions prior to admission  Medication Sig Dispense Refill Last Dose  . ferrous sulfate 325 (65 FE) MG tablet Take 325 mg by mouth daily with breakfast.   08/23/2015 at Unknown time  . ibuprofen (ADVIL,MOTRIN) 800 MG tablet Take 1 tablet (800 mg total) by mouth every 8 (eight) hours. 45 tablet 0 08/24/2015 at Unknown time  . Omega-3 Fatty Acids (FISH OIL EXTRA STRENGTH PO) Take 1 tablet by mouth daily.   08/23/2015 at Unknown time  . oxyCODONE-acetaminophen (PERCOCET/ROXICET) 5-325 MG tablet Take 1-2 tablets by mouth every 6 (six) hours as needed (pain scale 4-7). 30 tablet 0 Past Week at Unknown time  . prenatal vitamin w/FE, FA (PRENATAL 1 + 1) 27-1 MG TABS tablet Take 1 tablet by mouth daily. 100 each 3 08/23/2015 at Unknown time    Review of Systems  Constitutional: Negative.  Eyes: Negative for blurred vision.  Respiratory: Positive for shortness of breath. Negative for cough, sputum production and wheezing.  Cardiovascular: Positive for orthopnea and leg swelling. Negative for chest pain and palpitations.  Gastrointestinal: Negative.  Musculoskeletal: Positive for back pain.  Neurological: Negative for dizziness, loss of consciousness and headaches.   Physical Exam   Blood pressure 182/72, pulse 49, temperature 98.6 F (37 C), temperature source Oral, resp. rate 22, SpO2 100 %, currently breastfeeding.  Patient Vitals for the past 24  hrs:  BP Temp Temp src Pulse Resp SpO2  08/24/15 0904 166/82 mmHg - - 68 - 99 %  08/24/15 0849 162/91 mmHg - - 63 - 98 %  08/24/15 0848 - - - 60 - -  08/24/15 0846 179/83 mmHg - - - - -  08/24/15 0844 - - - (!) 58 - 100 %  08/24/15 0840 179/83 mmHg - - 61 - -  08/24/15 0806 193/97 mmHg - - (!) 51 - -  08/24/15 0804 (!) 186/114 mmHg - - (!) 48 - 100  %  08/24/15 0754 197/95 mmHg - - (!) 48 - 100 %  08/24/15 0715 156/88 mmHg - - (!) 50 - 100 %  08/24/15 0707 182/72 mmHg 98.6 F (37 C) Oral (!) 49 22 100 %    Physical Exam  Nursing note and vitals reviewed. Constitutional: She is oriented to person, place, and time. She appears well-developed and well-nourished. No distress.  HENT:  Head: Normocephalic and atraumatic.  Eyes: Conjunctivae are normal. Right eye exhibits no discharge. Left eye exhibits no discharge. No scleral icterus.  Neck: Normal range of motion.  Cardiovascular: Regular rhythm and normal heart sounds. Bradycardia present.  No murmur heard. Respiratory: Tachypnea noted. She is in respiratory distress. She has decreased breath sounds in the right lower field and the left lower field. She has no wheezes. She has no rhonchi. She has no rales.  Musculoskeletal: She exhibits edema (BLE).  Neurological: She is alert and oriented to person, place, and time. She has normal reflexes.  No clonus  Skin: Skin is warm and dry. She is not diaphoretic.  Psychiatric: She has a normal mood and affect. Her behavior is normal. Judgment and thought content normal.    MAU Course  Procedures  MDM EKG - sinus bradycardia Ordered preE protocol with hydral for severe range BP 0740- Initially s/w Dr. Terri Piedra regarding patient. Informed of EKG, VS, presentation, and labs ordered. Elida with CT order to r/o PE. Will call Dr. Willis Modena with results.  Pt to CT Care turned over to D Poe CNM  Jorje Guild, NP 08/24/2015 8:21 AM    Lab Results Last 24 Hours    Results for orders placed or performed during the hospital encounter of 08/24/15 (from the past 24 hour(s))  CBC Status: Abnormal   Collection Time: 08/24/15 8:05 AM  Result Value Ref Range   WBC 8.1 4.0 - 10.5 K/uL   RBC 4.08 3.87 - 5.11 MIL/uL   Hemoglobin 10.3 (L) 12.0 - 15.0 g/dL   HCT 31.3 (L) 36.0 - 46.0 %   MCV  76.7 (L) 78.0 - 100.0 fL   MCH 25.2 (L) 26.0 - 34.0 pg   MCHC 32.9 30.0 - 36.0 g/dL   RDW 14.9 11.5 - 15.5 %   Platelets 218 150 - 400 K/uL  Comprehensive metabolic panel Status: Abnormal   Collection Time: 08/24/15 8:05 AM  Result Value Ref Range   Sodium 140 135 - 145 mmol/L   Potassium 4.1 3.5 - 5.1 mmol/L   Chloride 109 101 - 111 mmol/L   CO2 25 22 - 32 mmol/L   Glucose, Bld 106 (H) 65 - 99 mg/dL   BUN 12 6 - 20 mg/dL   Creatinine, Ser 0.64 0.44 - 1.00 mg/dL   Calcium 8.9 8.9 - 10.3 mg/dL   Total Protein 6.2 (L) 6.5 - 8.1 g/dL   Albumin 3.0 (L) 3.5 - 5.0 g/dL   AST 63 (H) 15 - 41 U/L  ALT 65 (H) 14 - 54 U/L   Alkaline Phosphatase 85 38 - 126 U/L   Total Bilirubin 0.3 0.3 - 1.2 mg/dL   GFR calc non Af Amer >60 >60 mL/min   GFR calc Af Amer >60 >60 mL/min   Anion gap 6 5 - 15  D-dimer, quantitative (not at [facility]) Status: Abnormal   Collection Time: 08/24/15 8:05 AM  Result Value Ref Range   D-Dimer, Quant 3.89 (H) 0.00 - 0.50 ug/mL-FEU    Ct Angio Chest Pe W/cm &/or Wo Cm  08/24/2015 CLINICAL DATA: Shortness of breath. Recent Cesarean section on 08/17/2015. EXAM: CT ANGIOGRAPHY CHEST WITH CONTRAST TECHNIQUE: Multidetector CT imaging of the chest was performed using the standard protocol during bolus administration of intravenous contrast. Multiplanar CT image reconstructions and MIPs were obtained to evaluate the vascular anatomy. CONTRAST: 100 mL Isovue 370 IV COMPARISON: None. FINDINGS: Mediastinum/Nodes: The pulmonary arteries are well opacified. There is no evidence of pulmonary embolism. The thoracic aorta is normal in caliber. The heart size is at the upper limits of normal. No evidence of pericardial effusion. No lymphadenopathy. Lungs/Pleura: Lungs show distention of pulmonary veins and evidence of interstitial edema with some patchy areas of alveolar edema  present. There are associated tiny bilateral pleural effusions. No focal airspace consolidation, nodule or pneumothorax. Upper abdomen: Visualized upper abdomen shows diffuse hepatic steatosis. Musculoskeletal: Bony structures are normal. Review of the MIP images confirms the above findings. IMPRESSION: 1. No evidence of pulmonary embolism. 2. Pulmonary interstitial edema with some patchy areas of alveolar edema and tiny bilateral pleural effusions. 3. Hepatic steatosis. Electronically Signed By: Aletta Edouard M.D. On: 08/24/2015 08:47  Hydralazine given x2 Addendum: gradual onset DOE yesterday, worse when supine, persisting this am. Generalized edema worsening. Had been on antihypertensives prepregnancy and labatelol in pregnancy. Infant stable in NICU with MAS. Pumping breasts. 3+ pretibial pitting edema C/W Dr. Willis Modena who will admit pt. Lasix 20 mg IV now.    Assessment and Plan  40 yo [id number] POD#6 ERLTCS CHTN that was well controlled initially postpartum without medication, now with probable postpartum preeclampsia with BP in the severe range and mild pulmonary edema.  Will admit for diuresis with Lasix, magnesium for seizure prophylaxis, will restart PO labetalol to help with BP which has responded will to IV Hydralazine.

## 2015-08-24 NOTE — Progress Notes (Signed)
Assumed care from [facility]. Patient stable.

## 2015-08-24 NOTE — MAU Note (Signed)
C/o dizziness and SOB since yesterday; delivered on 08-17-15 via repeat c-section; pt's baby in the NICU; states that she use to take a medicine to lower her pulse; has been on hypertension meds but is not on BP meds since delivery;

## 2015-08-25 LAB — COMPREHENSIVE METABOLIC PANEL
ALK PHOS: 77 U/L (ref 38–126)
ALT: 68 U/L — AB (ref 14–54)
AST: 52 U/L — ABNORMAL HIGH (ref 15–41)
Albumin: 2.8 g/dL — ABNORMAL LOW (ref 3.5–5.0)
Anion gap: 6 (ref 5–15)
BILIRUBIN TOTAL: 0.2 mg/dL — AB (ref 0.3–1.2)
BUN: 14 mg/dL (ref 6–20)
CALCIUM: 8.1 mg/dL — AB (ref 8.9–10.3)
CO2: 28 mmol/L (ref 22–32)
CREATININE: 0.87 mg/dL (ref 0.44–1.00)
Chloride: 105 mmol/L (ref 101–111)
Glucose, Bld: 109 mg/dL — ABNORMAL HIGH (ref 65–99)
Potassium: 3.8 mmol/L (ref 3.5–5.1)
SODIUM: 139 mmol/L (ref 135–145)
TOTAL PROTEIN: 5.7 g/dL — AB (ref 6.5–8.1)

## 2015-08-25 MED ORDER — LABETALOL HCL 200 MG PO TABS: 200.0000 mg | ORAL_TABLET | Freq: Two times a day (BID) | ORAL | Status: DC

## 2015-08-25 MED ORDER — HYDROCHLOROTHIAZIDE 25 MG PO TABS: 25.0000 mg | ORAL_TABLET | Freq: Every day | ORAL | Status: DC

## 2015-08-25 MED ORDER — HYDROCHLOROTHIAZIDE 25 MG PO TABS
25.0000 mg | ORAL_TABLET | Freq: Every day | ORAL | Status: DC
  Administered 2015-08-25: 25 mg via ORAL
  Filled 2015-08-25: qty 1

## 2015-08-25 MED ORDER — FUROSEMIDE 20 MG PO TABS
10.0000 mg | ORAL_TABLET | Freq: Once | ORAL | Status: AC
  Administered 2015-08-25: 10 mg via ORAL
  Filled 2015-08-25: qty 0.5

## 2015-08-25 NOTE — Progress Notes (Addendum)
Patient ID: Suzanne Andrade, female   DOB: 12-Nov-1975, 40 y.o.   MRN: [id number] Pt doing well. No headaches, blurry vision, CP. SOB improved significantly. Still voiding well. Denies pain.  VS 145/66, 64 ABD - soft, nt EXT - + edema but no homans, no pitting   A/P: POD#7 s/p r c/s, pp PreE s/p MgSO4 - bp now stable        Will give one more po dose of lasixs        Will d/c to home today rx for hctz and labetatol        Pt to f/u in office next week for bp check

## 2015-08-25 NOTE — Progress Notes (Signed)
HD #2 postpartum preeclampsia, PPD#7 Feeling better, breathing better, headache better Afeb, VSS, BP 130-150/70-90 I/O down 3.5L Abd- incision healing well Labs- CMP ok, LFTs still slightly elevated BP and symptoms improved.  Will d/c magnesium, continue PO labetalol and prn IV hydralazine, received PO lasix this am, will d/c for future and start HCTZ which she has been on for her BP in the past.  Evaluate later today for possible discharge.

## 2015-08-25 NOTE — Progress Notes (Signed)
Patient discharged tonight with assessments remaining unchanged. Patient educated on the need to follow up after discharge and call the doctor if having any breathing difficulties or swelling. Discharge paper given and went over with her. All questions answered. Patient was then accompanied to her car by the NT.

## 2015-08-25 NOTE — Discharge Instructions (Signed)
Nothing in vagina for 5 weeks.  No sex, tampons, and douching.  Other instructions as in [employer]

## 2015-08-26 ENCOUNTER — Ambulatory Visit: Payer: Self-pay

## 2015-08-26 NOTE — Lactation Note (Signed)
This note was copied from a baby's chart. Lactation Consultation Note  Met with mom in the NICU.  She was discharged last night after being readmitted for treatment of elevated blood pressure.  Mom admits to feeling fatigued and stressed.  She is concerned about her milk supply.  She is pumping every 3 hours and obtaining 20-66ms each pumping.  Discussed how stress, fatigue and illness can lower milk supply.  We talked about options to increase rest and reduce stress.  Instructed to increase pumping during the day to every 2-3 hours and 4 hours at night to allow for more rest.  Encouraged her to seek out support from family and friends especially caring for her 113month old son.  Praised mom for her pumping efforts.  Patient Name: Boy SGhazalAderotimi TYVGCY'ODate: 08/26/2015     Maternal Data    Feeding Feeding Type: Breast Milk Length of feed: 60 min  LATCH Score/Interventions                      Lactation Tools Discussed/Used     Consult Status      MAve Filter4/14/2017, 4:07 PM

## 2015-09-07 NOTE — Discharge Summary (Signed)
OB Discharge Summary     Patient Name: Suzanne Andrade DOB: [birth year] MRN: [id number]  Date of admission: 08/24/2015 Delivering MD: Janyth Contes   Date of discharge: 09/07/2015  Admitting diagnosis: PP CS DIFFICULTY BREATHING Intrauterine pregnancy: [redacted]w[redacted]d     Secondary diagnosis:  Active Problems:   Preeclampsia in postpartum period  Additional problems: none     Discharge diagnosis: Term Pregnancy Delivered and CHTN                                                                                                Post partum procedures:none  Augmentation: none  Complications: None  Hospital course:  Sceduled C/S   40 y.o. yo [id number] at [redacted]w[redacted]d was admitted to the hospital 08/24/2015 for scheduled cesarean section with the following indication:Elective Repeat.  Membrane Rupture Time/Date: 10:01 AM ,08/17/2015   Patient delivered a Viable infant.08/17/2015  Details of operation can be found in separate operative note.  Pateint had an uncomplicated postpartum course.  She is ambulating, tolerating a regular diet, passing flatus, and urinating well. Patient is discharged home in stable condition on  09/07/2015          Physical exam  Filed Vitals:   08/25/15 0546 08/25/15 1000 08/25/15 1400 08/25/15 1833  BP: 143/74 134/70 135/70 145/66  Pulse: 69 79 66 64  Temp: 98.2 F (36.8 C) 99.3 F (37.4 C) 98.9 F (37.2 C) 98.9 F (37.2 C)  TempSrc: Oral Oral Oral Oral  Resp: 16 16 18 18   Height:      Weight:      SpO2: 100% 100% 99% 100%   General: alert, cooperative and no distress Lochia: appropriate Uterine Fundus: firm Incision: Healing well with no significant drainage, No significant erythema DVT Evaluation: Calf/Ankle edema is present Labs: Lab Results  Component Value Date   WBC 8.1 08/24/2015   HGB 10.3* 08/24/2015   HCT 31.3* 08/24/2015   MCV 76.7* 08/24/2015   PLT 218 08/24/2015   CMP Latest Ref Rng 08/25/2015  Glucose 65 - 99 mg/dL 109(H)  BUN 6 - 20  mg/dL 14  Creatinine 0.44 - 1.00 mg/dL 0.87  Sodium 135 - 145 mmol/L 139  Potassium 3.5 - 5.1 mmol/L 3.8  Chloride 101 - 111 mmol/L 105  CO2 22 - 32 mmol/L 28  Calcium 8.9 - 10.3 mg/dL 8.1(L)  Total Protein 6.5 - 8.1 g/dL 5.7(L)  Total Bilirubin 0.3 - 1.2 mg/dL 0.2(L)  Alkaline Phos 38 - 126 U/L 77  AST 15 - 41 U/L 52(H)  ALT 14 - 54 U/L 68(H)    Discharge instruction: per After Visit Summary and "Baby and Me Booklet".  After visit meds:    Medication List    TAKE these medications        ferrous sulfate 325 (65 FE) MG tablet  Take 325 mg by mouth daily with breakfast.     FISH OIL EXTRA STRENGTH PO  Take 1 tablet by mouth daily.     hydrochlorothiazide 25 MG tablet  Commonly known as:  HYDRODIURIL  Take 1 tablet (25 mg total) by mouth daily.  ibuprofen 800 MG tablet  Commonly known as:  ADVIL,MOTRIN  Take 1 tablet (800 mg total) by mouth every 8 (eight) hours.     labetalol 200 MG tablet  Commonly known as:  NORMODYNE  Take 1 tablet (200 mg total) by mouth 2 (two) times daily.     oxyCODONE-acetaminophen 5-325 MG tablet  Commonly known as:  PERCOCET/ROXICET  Take 1-2 tablets by mouth every 6 (six) hours as needed (pain scale 4-7).     prenatal vitamin w/FE, FA 27-1 MG Tabs tablet  Take 1 tablet by mouth daily.        Diet: low salt diet  Activity: Advance as tolerated. Pelvic rest for 6 weeks.   Outpatient follow up:2 weeks Follow up Appt:No future appointments. Follow up Visit:No Follow-up on file.  Postpartum contraception: Not Discussed  Newborn Data: Live born female  Birth Weight: 7 lb 11.1 oz (3490 g) APGAR: 6, 8  Baby Feeding: Bottle Disposition:NICU   09/07/2015 Sherlyn Hay, DO

## 2015-09-30 ENCOUNTER — Ambulatory Visit: Payer: Self-pay

## 2015-09-30 NOTE — Lactation Note (Signed)
This note was copied from a baby's chart. Lactation Consultation Note  Patient Name: Suzanne Andrade [id number] Date: 09/30/2015 Reason for consult: Follow-up assessment;NICU baby   Mom in NICU visiting infant. She reports she is having difficulty getting enough pumpings in a day. She is pumping every 4-5 hours. She spends a good amount of time in NICU holding her baby upright while he is being NG fed. Enc her to pump at bedside using DEBP. She may have to do one side at a time while holding infant. Help her set up pump and she began pumping. She is planning to get a hand free bra to assist her with pumping. Enc mom to call with further questions. She was appreciative of pumping suggestion.    Maternal Data    Feeding Feeding Type: Breast Milk Length of feed: 60 min  LATCH Score/Interventions                      Lactation Tools Discussed/Used     Consult Status Consult Status: PRN Follow-up type: Call as needed    Donn Pierini 09/30/2015, 1:46 PM

## 2016-02-06 ENCOUNTER — Encounter (HOSPITAL_COMMUNITY): Payer: Self-pay | Admitting: *Deleted

## 2016-02-06 ENCOUNTER — Inpatient Hospital Stay (HOSPITAL_COMMUNITY)
Admission: AD | Admit: 2016-02-06 | Discharge: 2016-02-06 | Disposition: A | Discharge status: Home / Self Care | Payer: Medicaid Other | Source: Ambulatory Visit | Attending: Family Medicine | Admitting: Family Medicine

## 2016-02-06 DIAGNOSIS — O161 Unspecified maternal hypertension, first trimester: Secondary | ICD-10-CM | POA: Insufficient documentation

## 2016-02-06 DIAGNOSIS — Z711 Person with feared health complaint in whom no diagnosis is made: Secondary | ICD-10-CM

## 2016-02-06 DIAGNOSIS — Z36 Encounter for antenatal screening of mother: Secondary | ICD-10-CM | POA: Diagnosis present

## 2016-02-06 DIAGNOSIS — F329 Major depressive disorder, single episode, unspecified: Secondary | ICD-10-CM | POA: Insufficient documentation

## 2016-02-06 DIAGNOSIS — O99341 Other mental disorders complicating pregnancy, first trimester: Secondary | ICD-10-CM | POA: Insufficient documentation

## 2016-02-06 DIAGNOSIS — O30001 Twin pregnancy, unspecified number of placenta and unspecified number of amniotic sacs, first trimester: Secondary | ICD-10-CM | POA: Insufficient documentation

## 2016-02-06 DIAGNOSIS — O09511 Supervision of elderly primigravida, first trimester: Secondary | ICD-10-CM | POA: Insufficient documentation

## 2016-02-06 DIAGNOSIS — Z3201 Encounter for pregnancy test, result positive: Secondary | ICD-10-CM | POA: Diagnosis not present

## 2016-02-06 DIAGNOSIS — F419 Anxiety disorder, unspecified: Secondary | ICD-10-CM | POA: Insufficient documentation

## 2016-02-06 DIAGNOSIS — Z3A11 11 weeks gestation of pregnancy: Secondary | ICD-10-CM | POA: Insufficient documentation

## 2016-02-06 LAB — URINALYSIS, ROUTINE W REFLEX MICROSCOPIC
Bilirubin Urine: NEGATIVE
GLUCOSE, UA: NEGATIVE mg/dL
KETONES UR: NEGATIVE mg/dL
LEUKOCYTES UA: NEGATIVE
Nitrite: NEGATIVE
PH: 5.5 (ref 5.0–8.0)
Protein, ur: NEGATIVE mg/dL
Specific Gravity, Urine: 1.01 (ref 1.005–1.030)

## 2016-02-06 LAB — URINE MICROSCOPIC-ADD ON: Squamous Epithelial / LPF: NONE SEEN

## 2016-02-06 LAB — POCT PREGNANCY, URINE: Preg Test, Ur: POSITIVE — AB

## 2016-02-06 NOTE — MAU Note (Signed)
Was seen at "[facility]" to have an abortion and states they did an U/S. States they said there were 2 sacs that were empty, but "not sure." No bleeding. No abdominal pain. States she has a HA and some back pain.

## 2016-02-06 NOTE — Discharge Instructions (Signed)
Human Chorionic Gonadotropin Test Human chorionic gonadotropin (hCG) is a hormone produced during pregnancy by the cells that form the placenta. The placenta is the organ that grows inside your womb (uterus) to nourish a developing baby. When you are pregnant, hCG starts to appear in your blood about 11 days after conception. It continues to go up for the first 8-11 weeks of pregnancy.  Your hCG level can be measured with several different types of tests. You may have:  A urine test.  hCG is eliminated from your body by your kidneys, so a urine test is one way to check for this hormone.  A urine test only shows whether there is hCG in your urine. It does not measure how much.  You may have a urine test to find out whether you are pregnant.  A home pregnancy test detects whether there is hCG in your urine.  A qualitative blood test.  Like the urine test, this blood test only shows whether there is hCG in your blood. It does not measure how much.  You may have this type of blood test to find out whether you are pregnant.  A quantitative blood test.  This type of blood test measures the amount of hCG in your blood.  You may have this type of test to diagnose an abnormal pregnancy or determine whether you are at risk of, or have had, a failed pregnancy (miscarriage). PREPARATION FOR TEST For the urine test:  Limit your fluid intake before the urine test as directed by your health care provider.  Collect the sample the first time you urinate in the morning.  Let your health care provider know if you have blood in your urine. This may interfere with the test result. Some medicines may interfere with the urine and blood tests. Let your health care provider know about all the medicines you are taking. No additional preparation is required for the blood test.  RESULTS It is your responsibility to obtain your test results. Ask the lab or department performing the test when and how you will  get your results. Talk to your health care provider if you have any questions about your test results. The results of the hCG urine test and the qualitative hCG blood test are either positive or negative. The results of the quantitative hCG blood test are reported as a number. hCG is measured in international units per liter (IU/L). Meaning of Negative Test Results A negative result on a urine or qualitative blood test could mean that you are not pregnant. It could also mean the test was done too early to detect hCG. If you still have other signs of pregnancy, the test should be repeated. Meaning of Positive Test Results A positive result on the urine or qualitative blood tests means you are most likely pregnant. Your health care provider may confirm your pregnancy with an imaging study of the inside of your uterus at 5-6 weeks (ultrasound).  Range of Normal Values Ranges for normal values for the quantitative hCG blood test may vary among different labs and hospitals. You should always check with your health care provider after having lab work or other tests done to discuss whether your values are considered within normal limits.   Less than 5 IU/L means it is most likely you are not pregnant.  Greater than 25 IU/L means it is most likely you are pregnant. Meaning of Results Outside Normal Value Ranges If your hCG level on the quantitative test is not  what would be expected, you may have the test again. It may also be important for your health care provider to know whether your hCG level goes up or down over time. Common causes of results outside the normal range include:   Being pregnant with twins (hCG level is higher than expected).  Having an ectopic pregnancy (hCG rises more slowly than expected).  Miscarriage (hCG level falls).  Abnormal growths in the womb (hCG level is higher than expected).   This information is not intended to replace advice given to you by your health care  provider. Make sure you discuss any questions you have with your health care provider.   Document Released: 06/01/2004 Document Revised: 05/21/2014 Document Reviewed: 08/04/2013 Elsevier Interactive Patient Education 2016 [employer].  Prenatal Care WHAT IS PRENATAL CARE?  Prenatal care is the process of caring for a pregnant woman before she gives birth. Prenatal care makes sure that she and her baby remain as healthy as possible throughout pregnancy. Prenatal care may be provided by a midwife, family practice health care provider, or a childbirth and pregnancy specialist (obstetrician). Prenatal care may include physical examinations, testing, treatments, and education on nutrition, lifestyle, and social support services. WHY IS PRENATAL CARE SO IMPORTANT?  Early and consistent prenatal care increases the chance that you and your baby will remain healthy throughout your pregnancy. This type of care also decreases a baby's risk of being born too early (prematurely), or being born smaller than expected (small for gestational age). Any underlying medical conditions you may have that could pose a risk during your pregnancy are discussed during prenatal care visits. You will also be monitored regularly for any new conditions that may arise during your pregnancy so they can be treated quickly and effectively. WHAT HAPPENS DURING PRENATAL CARE VISITS? Prenatal care visits may include the following: Discussion Tell your health care provider about any new signs or symptoms you have experienced since your last visit. These might include:  Nausea or vomiting.  Increased or decreased level of energy.  Difficulty sleeping.  Back or leg pain.  Weight changes.  Frequent urination.  Shortness of breath with physical activity.  Changes in your skin, such as the development of a rash or itchiness.  Vaginal discharge or bleeding.  Feelings of excitement or nervousness.  Changes in your baby's  movements. You may want to write down any questions or topics you want to discuss with your health care provider and bring them with you to your appointment. Examination During your first prenatal care visit, you will likely have a complete physical exam. Your health care provider will often examine your vagina, cervix, and the position of your uterus, as well as check your heart, lungs, and other body systems. As your pregnancy progresses, your health care provider will measure the size of your uterus and your baby's position inside your uterus. He or she may also examine you for early signs of labor. Your prenatal visits may also include checking your blood pressure and, after about 10-12 weeks of pregnancy, listening to your baby's heartbeat. Testing Regular testing often includes:  Urinalysis. This checks your urine for glucose, protein, or signs of infection.  Blood count. This checks the levels of white and red blood cells in your body.  Tests for sexually transmitted infections (STIs). Testing for STIs at the beginning of pregnancy is routinely done and is required in many states.  Antibody testing. You will be checked to see if you are immune to certain  illnesses, such as rubella, that can affect a developing fetus.  Glucose screen. Around 24-28 weeks of pregnancy, your blood glucose level will be checked for signs of gestational diabetes. Follow-up tests may be recommended.  Group B strep. This is a bacteria that is commonly found inside a woman's vagina. This test will inform your health care provider if you need an antibiotic to reduce the amount of this bacteria in your body prior to labor and childbirth.  Ultrasound. Many pregnant women undergo an ultrasound screening around 18-20 weeks of pregnancy to evaluate the health of the fetus and check for any developmental abnormalities.  HIV (human immunodeficiency virus) testing. Early in your pregnancy, you will be screened for HIV. If  you are at high risk for HIV, this test may be repeated during your third trimester of pregnancy. You may be offered other testing based on your age, personal or family medical history, or other factors.  HOW OFTEN SHOULD I PLAN TO SEE MY HEALTH CARE PROVIDER FOR PRENATAL CARE? Your prenatal care check-up schedule depends on any medical conditions you have before, or develop during, your pregnancy. If you do not have any underlying medical conditions, you will likely be seen for checkups:  Monthly, during the first 6 months of pregnancy.  Twice a month during months 7 and 8 of pregnancy.  Weekly starting in the 9th month of pregnancy and until delivery. If you develop signs of early labor or other concerning signs or symptoms, you may need to see your health care provider more often. Ask your health care provider what prenatal care schedule is best for you. WHAT CAN I DO TO KEEP MYSELF AND MY BABY AS HEALTHY AS POSSIBLE DURING MY PREGNANCY?  Take a prenatal vitamin containing 400 micrograms (0.4 mg) of folic acid every day. Your health care provider may also ask you to take additional vitamins such as iodine, vitamin D, iron, copper, and zinc.  Take 1500-2000 mg of calcium daily starting at your 20th week of pregnancy until you deliver your baby.  Make sure you are up to date on your vaccinations. Unless directed otherwise by your health care provider:  You should receive a tetanus, diphtheria, and pertussis (Tdap) vaccination between the 27th and 36th week of your pregnancy, regardless of when your last Tdap immunization occurred. This helps protect your baby from whooping cough (pertussis) after he or she is born.  You should receive an annual inactivated influenza vaccine (IIV) to help protect you and your baby from influenza. This can be done at any point during your pregnancy.  Eat a well-rounded diet that includes:  Fresh fruits and vegetables.  Lean proteins.  Calcium-rich foods  such as milk, yogurt, hard cheeses, and dark, leafy greens.  Whole grain breads.  Do noteat seafood high in mercury, including:  Swordfish.  Tilefish.  Shark.  King mackerel.  More than 6 oz tuna per week.  Do not eat:  Raw or undercooked meats or eggs.  Unpasteurized foods, such as soft cheeses (brie, blue, or feta), juices, and milks.  Lunch meats.  Hot dogs that have not been heated until they are steaming.  Drink enough water to keep your urine clear or pale yellow. For many women, this may be 10 or more 8 oz glasses of water each day. Keeping yourself hydrated helps deliver nutrients to your baby and may prevent the start of pre-term uterine contractions.  Do not use any tobacco products including cigarettes, chewing tobacco, or electronic cigarettes. If  you need help quitting, ask your health care provider.  Do not drink beverages containing alcohol. No safe level of alcohol consumption during pregnancy has been determined.  Do not use any illegal drugs. These can harm your developing baby or cause a miscarriage.  Ask your health care provider or pharmacist before taking any prescription or over-the-counter medicines, herbs, or supplements.  Limit your caffeine intake to no more than 200 mg per day.  Exercise. Unless told otherwise by your health care provider, try to get 30 minutes of moderate exercise most days of the week. Do not  do high-impact activities, contact sports, or activities with a high risk of falling, such as horseback riding or downhill skiing.  Get plenty of rest.  Avoid anything that raises your body temperature, such as hot tubs and saunas.  If you own a cat, do not empty its litter box. Bacteria contained in cat feces can cause an infection called toxoplasmosis. This can result in serious harm to the fetus.  Stay away from chemicals such as insecticides, lead, mercury, and cleaning or paint products that contain solvents.  Do not have any  X-rays taken unless medically necessary.  Take a childbirth and breastfeeding preparation class. Ask your health care provider if you need a referral or recommendation.   This information is not intended to replace advice given to you by your health care provider. Make sure you discuss any questions you have with your health care provider.   Document Released: 05/03/2003 Document Revised: 05/21/2014 Document Reviewed: 07/15/2013 Elsevier Interactive Patient Education [employer].

## 2016-02-06 NOTE — MAU Provider Note (Signed)
History     CSN: [id number]  Arrival date and time: 02/06/16 1057   First Provider Initiated Contact with Patient 02/06/16 1201      Chief Complaint  Patient presents with  . Location of pregnancy   HPI   Ms.Suzanne Andrade is a 40 y.o. female here to identify the location of her pregnancy. She is currently without complaints, she has no pain or no bleeding. She went to [facility] to terminate this pregnancy and was told she had two gestational sacs and possibly twins. She changed her mind and came here for a second opinion. She was told there two sacs but the sacs were empty.  She would like an [place] here to determine if she is having twins and if the pregnancy is healthy.   OB History    Gravida Para Term Preterm AB Living   8 6 6   1 6    SAB TAB Ectopic Multiple Live Births     1   0 6      Past Medical History:  Diagnosis Date  . Anxiety   . Depression    stopped meds with pregnancy  . Fibroid   . Headache(784.0)   . Hx of varicella   . Hypertension   . S/P cesarean section 01/05/2014  . Vaginal Pap smear, abnormal    ok since    Past Surgical History:  Procedure Laterality Date  . CESAREAN SECTION    . CESAREAN SECTION N/A 01/05/2014   Procedure: REPEAT CESAREAN SECTION;  Surgeon: Janyth Contes, MD;  Location: [facility];  Service: Obstetrics;  Laterality: N/A;  . CESAREAN SECTION N/A 08/17/2015   Procedure: CESAREAN SECTION;  Surgeon: Janyth Contes, MD;  Location: [facility];  Service: Obstetrics;  Laterality: N/A;  . CYST REMOVAL HAND Right 1992  . DILATION AND CURETTAGE OF UTERUS    . INDUCED ABORTION    . WISDOM TOOTH EXTRACTION Bilateral 2007    Family History  Problem Relation Age of Onset  . Mental illness Mother   . Hypertension Mother   . GER disease Mother   . Hypertension Father   . Diabetes Father   . Mental illness Maternal Aunt   . Mental illness Maternal Uncle   . Diabetes Maternal Grandmother     Social History  Substance Use  Topics  . Smoking status: Never Smoker  . Smokeless tobacco: Never Used  . Alcohol use No    Allergies: No Known Allergies  Prescriptions Prior to Admission  Medication Sig Dispense Refill Last Dose  . ferrous sulfate 325 (65 FE) MG tablet Take 325 mg by mouth daily with breakfast.   08/23/2015 at Unknown time  . hydrochlorothiazide (HYDRODIURIL) 25 MG tablet Take 1 tablet (25 mg total) by mouth daily. 30 tablet 0   . ibuprofen (ADVIL,MOTRIN) 800 MG tablet Take 1 tablet (800 mg total) by mouth every 8 (eight) hours. 45 tablet 0 08/24/2015 at Unknown time  . labetalol (NORMODYNE) 200 MG tablet Take 1 tablet (200 mg total) by mouth 2 (two) times daily. 60 tablet 1   . Omega-3 Fatty Acids (FISH OIL EXTRA STRENGTH PO) Take 1 tablet by mouth daily.   08/23/2015 at Unknown time  . oxyCODONE-acetaminophen (PERCOCET/ROXICET) 5-325 MG tablet Take 1-2 tablets by mouth every 6 (six) hours as needed (pain scale 4-7). 30 tablet 0 Past Week at Unknown time  . prenatal vitamin w/FE, FA (PRENATAL 1 + 1) 27-1 MG TABS tablet Take 1 tablet by mouth daily. 100 each  3 08/23/2015 at Unknown time   Results for orders placed or performed during the hospital encounter of 02/06/16 (from the past 24 hour(s))  Urinalysis, Routine w reflex microscopic (not at [facility])     Status: Abnormal   Collection Time: 02/06/16 11:35 AM  Result Value Ref Range   Color, Urine YELLOW YELLOW   APPearance CLEAR CLEAR   Specific Gravity, Urine 1.010 1.005 - 1.030   pH 5.5 5.0 - 8.0   Glucose, UA NEGATIVE NEGATIVE mg/dL   Hgb urine dipstick TRACE (A) NEGATIVE   Bilirubin Urine NEGATIVE NEGATIVE   Ketones, ur NEGATIVE NEGATIVE mg/dL   Protein, ur NEGATIVE NEGATIVE mg/dL   Nitrite NEGATIVE NEGATIVE   Leukocytes, UA NEGATIVE NEGATIVE  Urine microscopic-add on     Status: Abnormal   Collection Time: 02/06/16 11:35 AM  Result Value Ref Range   Squamous Epithelial / LPF NONE SEEN NONE SEEN   WBC, UA 0-5 0 - 5 WBC/hpf   RBC / HPF 0-5 0 -  5 RBC/hpf   Bacteria, UA RARE (A) NONE SEEN  Pregnancy, urine POC     Status: Abnormal   Collection Time: 02/06/16 11:58 AM  Result Value Ref Range   Preg Test, Ur POSITIVE (A) NEGATIVE    Review of Systems  Constitutional: Negative for chills and fever.  Gastrointestinal: Negative for abdominal pain, nausea and vomiting.  Genitourinary: Negative for dysuria, frequency, hematuria and urgency.   Physical Exam   Blood pressure (!) 118/49, pulse 74, temperature 99.1 F (37.3 C), temperature source Oral, resp. rate 18, height 5\' 4"  (1.626 m), weight 280 lb (127 kg), last menstrual period 11/20/2015, currently breastfeeding.   Patient Vitals for the past 24 hrs:  BP Temp Temp src Pulse Resp Height Weight  02/06/16 1246 (!) 118/49 - - 74 18 - -  02/06/16 1156 99/63 - - 71 - - -  02/06/16 1130 (!) 103/45 99.1 F (37.3 C) Oral 65 20 5\' 4"  (1.626 m) 280 lb (127 kg)    Physical Exam  Constitutional: She is oriented to person, place, and time. She appears well-developed and well-nourished. No distress.  HENT:  Head: Normocephalic.  Respiratory: Effort normal.  Musculoskeletal: Normal range of motion.  Neurological: She is alert and oriented to person, place, and time.  Skin: Skin is warm. She is not diaphoretic.  Psychiatric: Her behavior is normal.    MAU Course  Procedures  None  MDM  UA UPT   Assessment and Plan   A:  Encounter for pregnancy test, result positive  Physically well but worried   P:  Discharge home in stable condition If the patient develops pain or bleeding she is to return to MAU ASAP [place] scheduled for viability on 9/29 @ 0800; patient to follow up in the [place] after U/s Stop Lisinopril and HCTZ. Monitor your BP at home.

## 2016-02-10 ENCOUNTER — Ambulatory Visit (HOSPITAL_COMMUNITY)
Admit: 2016-02-10 | Discharge: 2016-02-10 | Disposition: A | Discharge status: Home / Self Care | Payer: Medicaid Other | Attending: Obstetrics and Gynecology | Admitting: Obstetrics and Gynecology

## 2016-02-10 ENCOUNTER — Ambulatory Visit: Payer: Medicaid Other

## 2016-02-10 ENCOUNTER — Other Ambulatory Visit (HOSPITAL_COMMUNITY): Payer: Self-pay | Admitting: Obstetrics and Gynecology

## 2016-02-10 VITALS — BP 128/50

## 2016-02-10 DIAGNOSIS — O3680X0 Pregnancy with inconclusive fetal viability, not applicable or unspecified: Secondary | ICD-10-CM

## 2016-02-10 DIAGNOSIS — Z3687 Encounter for antenatal screening for uncertain dates: Secondary | ICD-10-CM

## 2016-02-10 NOTE — Progress Notes (Signed)
Patient informed of u/s results. She has been scheduled for another u/s results for viability. Appointment scheduled for 02/22/2016 at 8:15. Patient made aware of results.  Patient also wanted me to check her blood pressure since they have taken her off her blood pressure medications.

## 2016-02-21 ENCOUNTER — Ambulatory Visit (HOSPITAL_COMMUNITY): Payer: Medicaid Other

## 2016-02-22 ENCOUNTER — Ambulatory Visit (HOSPITAL_COMMUNITY): Payer: Medicaid Other

## 2016-02-27 ENCOUNTER — Ambulatory Visit (HOSPITAL_COMMUNITY): Admission: RE | Admit: 2016-02-27 | Payer: Medicaid Other | Source: Ambulatory Visit

## 2016-12-11 ENCOUNTER — Encounter (HOSPITAL_COMMUNITY): Payer: Self-pay

## 2017-01-29 IMAGING — CT CT ANGIO CHEST
1 of 2 series · 19 of 32 positions shown · IV contrast (OMNIPAQUE)
Comparison: None.

CLINICAL DATA: Shortness of breath. Recent Cesarean section on
08/17/2015.

EXAM:
CT ANGIOGRAPHY CHEST WITH CONTRAST
TECHNIQUE: Multidetector CT imaging of the chest was performed using the
standard protocol during bolus administration of intravenous
contrast. Multiplanar CT image reconstructions and MIPs were
obtained to evaluate the vascular anatomy.
CONTRAST:  100 mL Isovue 370 IV

[Series 8: (person_name) thins · axial · 0.68mm/px · z∈[+955,+1201]mm · 19 of 391 slices shown]
[im 20/391  lung]
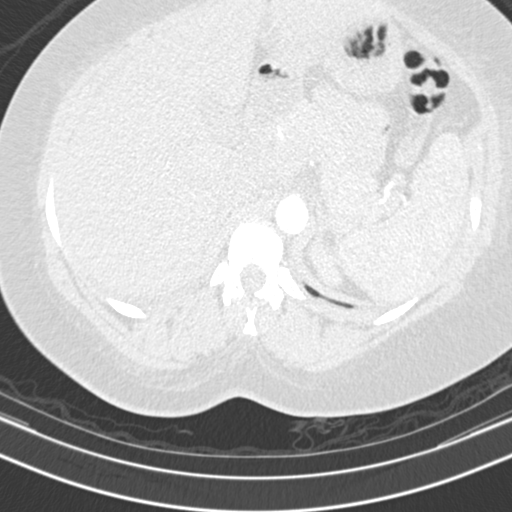
[im 40/391  mediastinal]
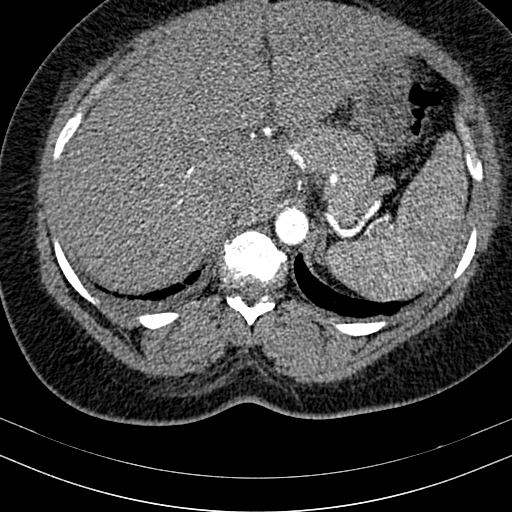
[im 59/391  lung]
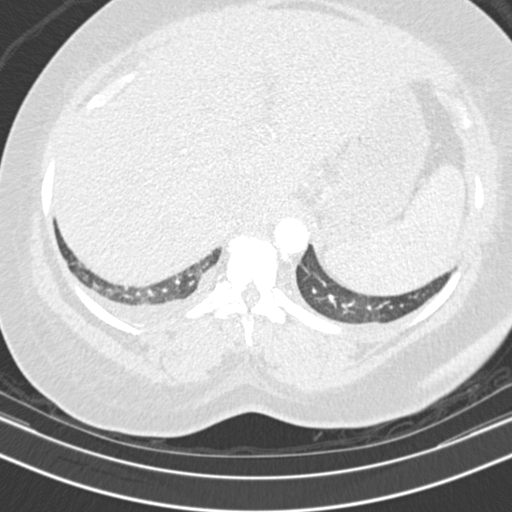
[im 98/391  mediastinal]
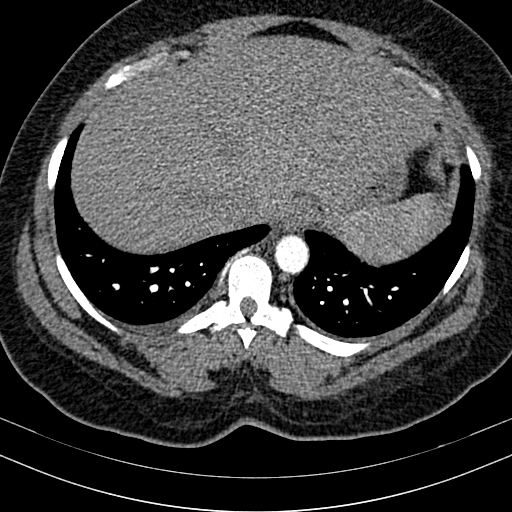
[im 118/391  lung]
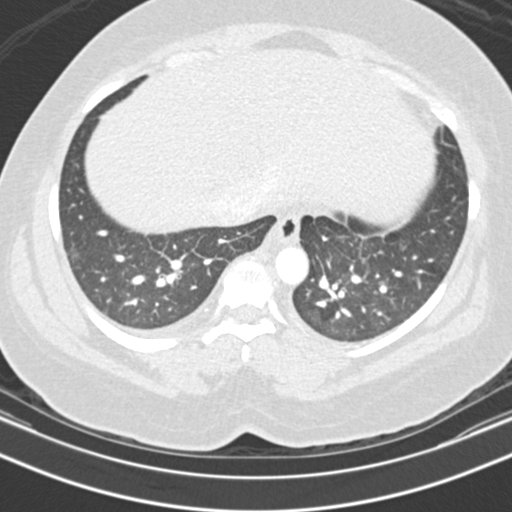
[im 131/391  mediastinal]
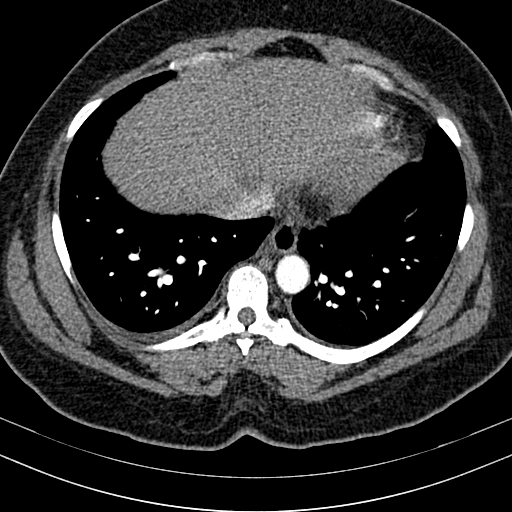
[im 137/391  lung]
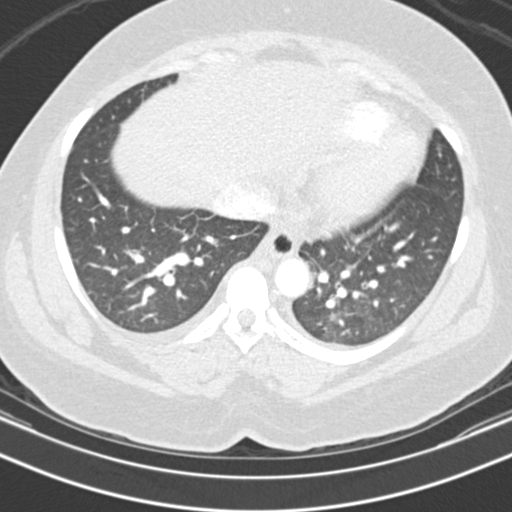
[im 157/391  mediastinal]
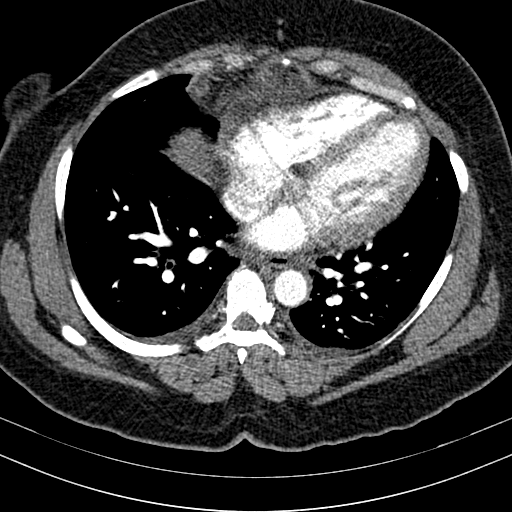
[im 176/391  lung]
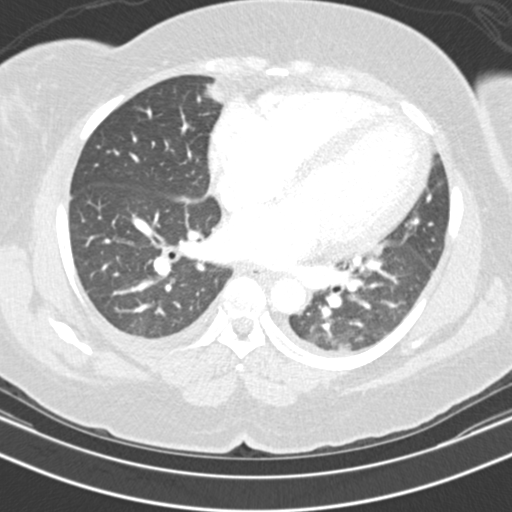
[im 196/391  mediastinal]
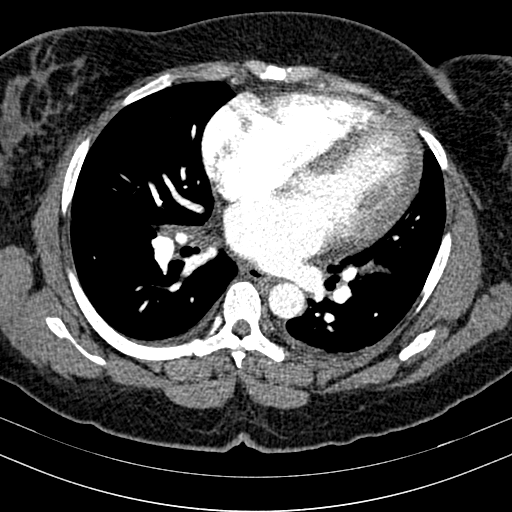
[im 215/391  lung]
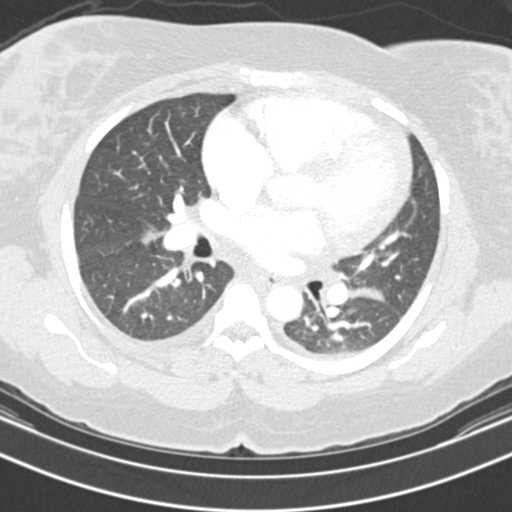
[im 235/391  mediastinal]
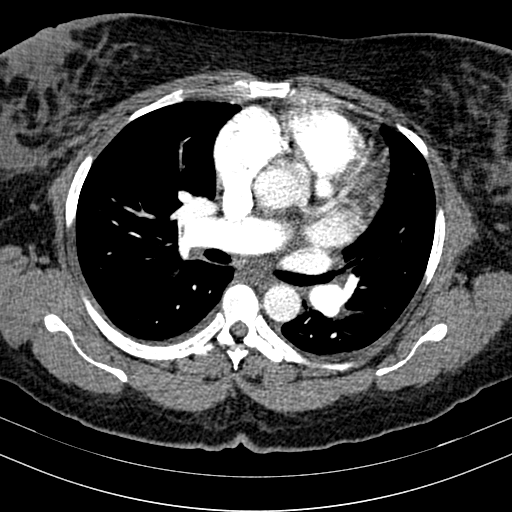
[im 254/391  lung]
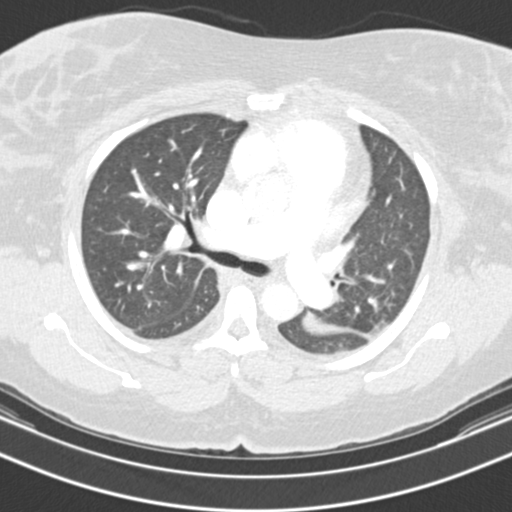
[im 261/391  mediastinal]
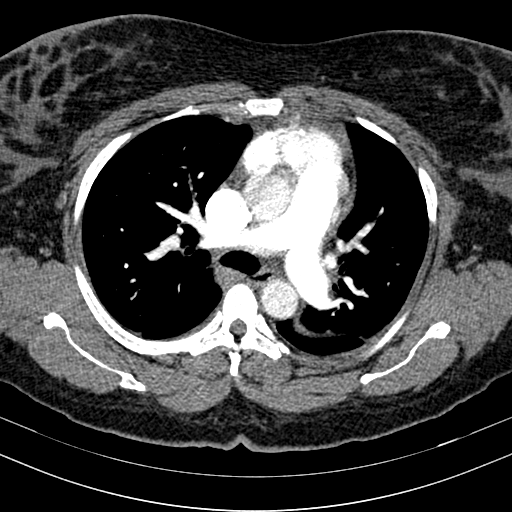
[im 274/391  lung]
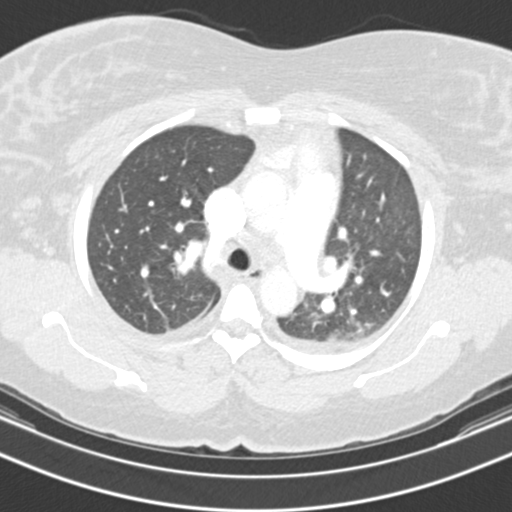
[im 293/391  mediastinal]
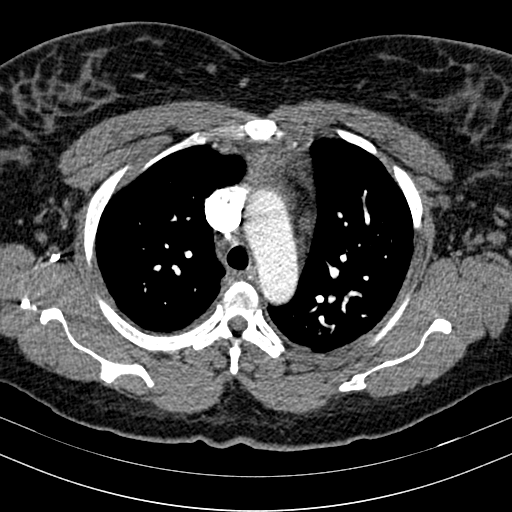
[im 332/391  lung]
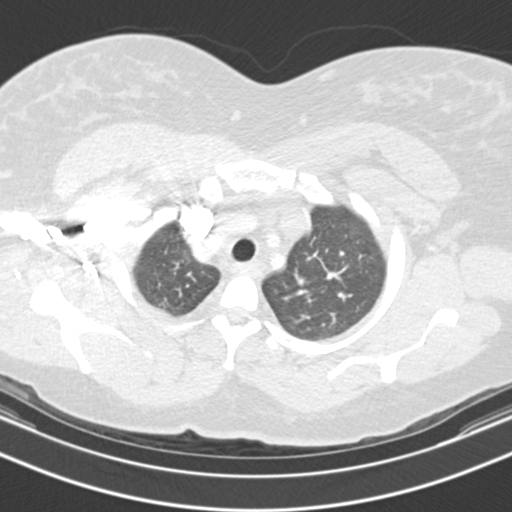
[im 352/391  mediastinal]
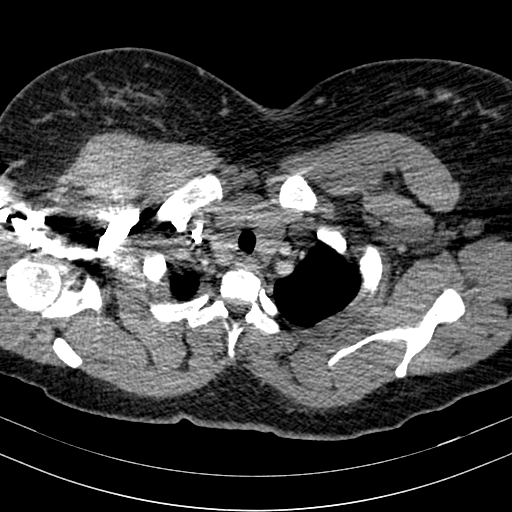
[im 371/391  lung]
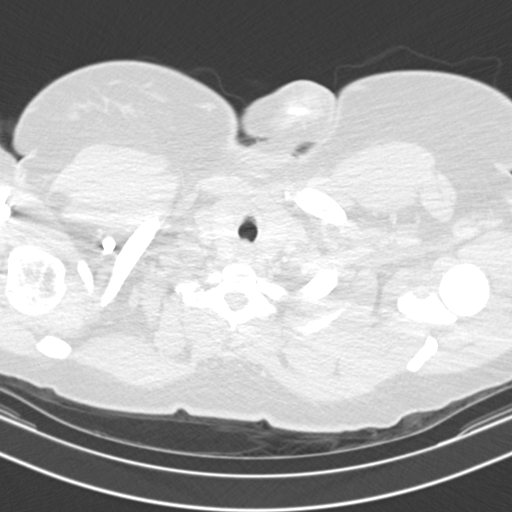

[19 of 32 positions shown; findings below may reference images not displayed]

FINDINGS: Mediastinum/Nodes: The pulmonary arteries are well opacified. There
is no evidence of pulmonary embolism. The thoracic aorta is normal
in caliber. The heart size is at the upper limits of normal. No
evidence of pericardial effusion. No lymphadenopathy.

Lungs/Pleura: Lungs show distention of pulmonary veins and evidence
of interstitial edema with some patchy areas of alveolar edema
present. There are associated tiny bilateral pleural effusions. No
focal airspace consolidation, nodule or pneumothorax.

Upper abdomen: Visualized upper abdomen shows diffuse hepatic
steatosis.

Musculoskeletal: Bony structures are normal.

Review of the MIP images confirms the above findings.
IMPRESSION: 1. No evidence of pulmonary embolism.
2. Pulmonary interstitial edema with some patchy areas of alveolar
edema and tiny bilateral pleural effusions.
3. Hepatic steatosis.

## 2017-12-14 ENCOUNTER — Encounter (HOSPITAL_COMMUNITY): Payer: Self-pay | Admitting: *Deleted

## 2017-12-14 ENCOUNTER — Emergency Department (HOSPITAL_COMMUNITY)
Admission: EM | Admit: 2017-12-14 | Discharge: 2017-12-14 | Disposition: A | Discharge status: Home / Self Care | Payer: Medicaid Other | Attending: Emergency Medicine | Admitting: Emergency Medicine

## 2017-12-14 ENCOUNTER — Emergency Department (HOSPITAL_COMMUNITY): Payer: Medicaid Other

## 2017-12-14 ENCOUNTER — Other Ambulatory Visit: Payer: Self-pay

## 2017-12-14 DIAGNOSIS — R109 Unspecified abdominal pain: Secondary | ICD-10-CM

## 2017-12-14 DIAGNOSIS — O219 Vomiting of pregnancy, unspecified: Secondary | ICD-10-CM | POA: Diagnosis not present

## 2017-12-14 DIAGNOSIS — Z79899 Other long term (current) drug therapy: Secondary | ICD-10-CM | POA: Diagnosis not present

## 2017-12-14 DIAGNOSIS — Z3A01 Less than 8 weeks gestation of pregnancy: Secondary | ICD-10-CM | POA: Insufficient documentation

## 2017-12-14 DIAGNOSIS — I10 Essential (primary) hypertension: Secondary | ICD-10-CM | POA: Diagnosis not present

## 2017-12-14 DIAGNOSIS — O30041 Twin pregnancy, dichorionic/diamniotic, first trimester: Secondary | ICD-10-CM | POA: Insufficient documentation

## 2017-12-14 LAB — I-STAT BETA HCG BLOOD, ED (MC, WL, AP ONLY)

## 2017-12-14 LAB — COMPREHENSIVE METABOLIC PANEL
ALBUMIN: 3.4 g/dL — AB (ref 3.5–5.0)
ALK PHOS: 44 U/L (ref 38–126)
ALT: 13 U/L (ref 0–44)
AST: 18 U/L (ref 15–41)
Anion gap: 11 (ref 5–15)
BILIRUBIN TOTAL: 0.5 mg/dL (ref 0.3–1.2)
BUN: 11 mg/dL (ref 6–20)
CALCIUM: 9 mg/dL (ref 8.9–10.3)
CO2: 26 mmol/L (ref 22–32)
Chloride: 100 mmol/L (ref 98–111)
Creatinine, Ser: 0.63 mg/dL (ref 0.44–1.00)
GFR calc Af Amer: >60 mL/min (ref >60)
GFR calc non Af Amer: >60 mL/min (ref >60)
GLUCOSE: 114 mg/dL — AB (ref 70–99)
POTASSIUM: 3.2 mmol/L — AB (ref 3.5–5.1)
SODIUM: 137 mmol/L (ref 135–145)
TOTAL PROTEIN: 6.8 g/dL (ref 6.5–8.1)

## 2017-12-14 LAB — URINALYSIS, ROUTINE W REFLEX MICROSCOPIC
BACTERIA UA: NONE SEEN
BILIRUBIN URINE: NEGATIVE
Glucose, UA: NEGATIVE mg/dL
Ketones, ur: 5 mg/dL — AB
Leukocytes, UA: NEGATIVE
NITRITE: NEGATIVE
PH: 6 (ref 5.0–8.0)
Protein, ur: NEGATIVE mg/dL
SPECIFIC GRAVITY, URINE: 1.015 (ref 1.005–1.030)

## 2017-12-14 LAB — CBC
HEMATOCRIT: 37.3 % (ref 36.0–46.0)
HEMOGLOBIN: 12.3 g/dL (ref 12.0–15.0)
MCH: 27.5 pg (ref 26.0–34.0)
MCHC: 33 g/dL (ref 30.0–36.0)
MCV: 83.3 fL (ref 78.0–100.0)
Platelets: 205 10*3/uL (ref 150–400)
RBC: 4.48 MIL/uL (ref 3.87–5.11)
RDW: 14.4 % (ref 11.5–15.5)
WBC: 12.5 10*3/uL — ABNORMAL HIGH (ref 4.0–10.5)

## 2017-12-14 LAB — LIPASE, BLOOD: Lipase: 25 U/L (ref 11–51)

## 2017-12-14 MED ORDER — FAMOTIDINE 20 MG PO TABS: 20.0000 mg | ORAL_TABLET | Freq: Two times a day (BID) | ORAL | 0 refills | Status: DC

## 2017-12-14 MED ORDER — ACETAMINOPHEN 325 MG PO TABS: 650.0000 mg | ORAL_TABLET | Freq: Four times a day (QID) | ORAL | 0 refills | Status: DC | PRN

## 2017-12-14 MED ORDER — ONDANSETRON HCL 4 MG/2ML IJ SOLN: 4.0000 mg | Freq: Once | INTRAMUSCULAR | Status: DC | PRN

## 2017-12-14 MED ORDER — ONDANSETRON 4 MG PO TBDP
4.0000 mg | ORAL_TABLET | Freq: Once | ORAL | Status: AC | PRN
  Administered 2017-12-14: 4 mg via ORAL
  Filled 2017-12-14: qty 1

## 2017-12-14 MED ORDER — FAMOTIDINE IN NACL 20-0.9 MG/50ML-% IV SOLN
20.0000 mg | Freq: Once | INTRAVENOUS | Status: AC
  Administered 2017-12-14: 20 mg via INTRAVENOUS
  Filled 2017-12-14: qty 50

## 2017-12-14 NOTE — ED Notes (Signed)
Urine culture in lab for holding

## 2017-12-14 NOTE — ED Provider Notes (Signed)
[place] DEPT Provider Note   CSN: [id number] Arrival date & time: 12/14/17  0320     History   Chief Complaint Chief Complaint  Patient presents with  . Vomiting    HPI Suzanne Andrade is a 42 y.o. female with a past medical history of hypertension, who presents to ED for evaluation of 3 to 4-day history of abdominal bloating, epigastric discomfort, several episodes of nonbloody, nonbilious emesis daily.  States that her abdominal pain will sometimes radiate to her back.  She has tried meclizine with no improvement in her symptoms.  She reports normal bowel movement this morning.  Denies any urinary symptoms.  States that she felt somewhat similar last time she was pregnant although did not have this type of pain.  She denies any sick contacts.  She initially thought it was due to something that she ate.  Denies any vaginal complaints, abnormal vaginal bleeding, fever, recent travel.  Patient reports daily alcohol use but denies any tobacco or other drug use.  Prior abdominal surgeries include C-section x4.  HPI  Past Medical History:  Diagnosis Date  . Anxiety   . Depression    stopped meds with pregnancy  . Fibroid   . Headache(784.0)   . Hx of varicella   . Hypertension   . S/P cesarean section 01/05/2014  . Vaginal Pap smear, abnormal    ok since    Patient Active Problem List   Diagnosis Date Noted  . Preeclampsia in postpartum period 08/24/2015  . S/P cesarean section 01/05/2014  . Abnormal TSH 06/28/2013  . Essential hypertension, benign 06/28/2013    Past Surgical History:  Procedure Laterality Date  . CESAREAN SECTION    . CESAREAN SECTION N/A 01/05/2014   Procedure: REPEAT CESAREAN SECTION;  Surgeon: Janyth Contes, MD;  Location: [facility];  Service: Obstetrics;  Laterality: N/A;  . CESAREAN SECTION N/A 08/17/2015   Procedure: CESAREAN SECTION;  Surgeon: Janyth Contes, MD;  Location: [facility];  Service: Obstetrics;   Laterality: N/A;  . CYST REMOVAL HAND Right 1992  . DILATION AND CURETTAGE OF UTERUS    . INDUCED ABORTION    . WISDOM TOOTH EXTRACTION Bilateral 2007     OB History    Gravida  8   Para  6   Term  6   Preterm      AB  1   Living  6     SAB      TAB  1   Ectopic      Multiple  0   Live Births  6            Home Medications    Prior to Admission medications   Medication Sig Start Date End Date Taking? Authorizing Provider  clonazePAM (KLONOPIN) 0.5 MG tablet Take 0.5 mg by mouth 2 (two) times daily as needed for anxiety.  09/15/17  Yes [provider]  FLUoxetine (PROZAC) 20 MG capsule Take 20 mg by mouth daily.   Yes [provider]  lisinopril-hydrochlorothiazide (PRINZIDE,ZESTORETIC) 20-12.5 MG tablet Take 1 tablet by mouth daily.   Yes [provider]  metoprolol succinate (TOPROL-XL) 25 MG 24 hr tablet Take 25 mg by mouth 2 (two) times daily.   Yes [provider]  Omega-3 Fatty Acids (FISH OIL EXTRA STRENGTH PO) Take 1 tablet by mouth daily.   Yes [provider]  VYVANSE 50 MG capsule Take 50 mg by mouth every morning. 11/23/17  Yes [provider]  acetaminophen (TYLENOL) 325 MG tablet Take 2 tablets (650 mg total) by mouth every 6 (six) hours as needed. 12/14/17   Stanely Sexson, PA-C  famotidine (PEPCID) 20 MG tablet Take 1 tablet (20 mg total) by mouth 2 (two) times daily. 12/14/17   Delia Heady, PA-C    Family History Family History  Problem Relation Age of Onset  . Mental illness Mother   . Hypertension Mother   . GER disease Mother   . Hypertension Father   . Diabetes Father   . Mental illness Maternal Aunt   . Mental illness Maternal Uncle   . Diabetes Maternal Grandmother     Social History Social History   Tobacco Use  . Smoking status: Never Smoker  . Smokeless tobacco: Never Used  Substance Use Topics  . Alcohol use: No  . Drug use: No     Allergies   Patient has no known  allergies.   Review of Systems Review of Systems  Constitutional: Negative for appetite change, chills and fever.  HENT: Negative for ear pain, rhinorrhea, sneezing and sore throat.   Eyes: Negative for photophobia and visual disturbance.  Respiratory: Negative for cough, chest tightness, shortness of breath and wheezing.   Cardiovascular: Negative for chest pain and palpitations.  Gastrointestinal: Positive for abdominal pain, nausea and vomiting. Negative for blood in stool, constipation and diarrhea.  Genitourinary: Negative for dysuria, hematuria and urgency.  Musculoskeletal: Positive for back pain. Negative for myalgias.  Skin: Negative for rash.  Neurological: Negative for dizziness, weakness and light-headedness.     Physical Exam Updated Vital Signs BP 115/70   Pulse 69   Temp 98.1 F (36.7 C) (Oral)   Resp 16   LMP 11/09/2017   SpO2 100%   Physical Exam  Constitutional: She appears well-developed and well-nourished. No distress.  HENT:  Head: Normocephalic and atraumatic.  Nose: Nose normal.  Eyes: Pupils are equal, round, and reactive to light. Conjunctivae and EOM are normal. Right eye exhibits no discharge. Left eye exhibits no discharge. No scleral icterus.  Neck: Normal range of motion. Neck supple.  Cardiovascular: Normal rate, regular rhythm, normal heart sounds and intact distal pulses. Exam reveals no gallop and no friction rub.  No murmur heard. Pulmonary/Chest: Effort normal and breath sounds normal. No respiratory distress.  Abdominal: Soft. Bowel sounds are normal. She exhibits no distension. There is tenderness (Epigastric). There is no guarding.  Musculoskeletal: Normal range of motion. She exhibits no edema.  Neurological: She is alert. She exhibits normal muscle tone. Coordination normal.  Skin: Skin is warm and dry. No rash noted.  Psychiatric: She has a normal mood and affect.  Nursing note and vitals reviewed.    ED Treatments / Results    Labs (all labs ordered are listed, but only abnormal results are displayed) Labs Reviewed  COMPREHENSIVE METABOLIC PANEL - Abnormal; Notable for the following components:      Result Value   Potassium 3.2 (*)    Glucose, Bld 114 (*)    Albumin 3.4 (*)    All other components within normal limits  CBC - Abnormal; Notable for the following components:   WBC 12.5 (*)    All other components within normal limits  URINALYSIS, ROUTINE W REFLEX MICROSCOPIC - Abnormal; Notable for the following components:   Hgb urine dipstick SMALL (*)    Ketones, ur 5 (*)    All other components within normal limits  I-STAT BETA HCG BLOOD, ED ([facility], [facility], [facility] ONLY) - Abnormal;  Notable for the following components:   I-stat hCG, quantitative >2,000.0 (*)    All other components within normal limits  LIPASE, BLOOD    EKG None  Radiology US Ob Comp < 14 Wks  Result Date: 12/14/2017 CLINICAL DATA:  Abdominal pain for 3 days. First trimester pregnancy. LMP 10/25/2017. EXAM: TWIN OBSTETRIC <14WK [place] AND TRANSVAGINAL OB US COMPARISON:  Obstetric ultrasound 02/10/2016 FINDINGS: Study is limited by body habitus and bowel gas. The ovaries were not visualized. Number of IUPs: There is a dominant gestational sac which contains an embryo, labeled twin B. Adjacent to this is a smaller rounded fluid collection with echogenic margins which likely represents a 2nd intrauterine gestational sac (likely dichorionic- diamniotic). No embryo is seen within this 2nd gestational sac which is labeled twin A. TWIN A Yolk sac:  Not visualized Embryo:  Not visualized Cardiac Activity: Not visualized MSD: 12.5 mm TWIN B Yolk sac:  Visualized Embryo:  Visualized Cardiac Activity: Present Heart Rate: 155 bpm CRL:  10.8 mm   7 w 1 d                  [place] EDC: 08/01/2018 Subchorionic hemorrhage:  None visualized. Maternal uterus/adnexae: The ovaries are not visualized. No adnexal abnormality identified. IMPRESSION: Suspected twin pregnancy. Only 1  viable embryo is visualized (labeled twin B), with a best estimated gestational age of [redacted] weeks 1 day based on crown-rump length. The adjacent smaller gestational sac demonstrates no viable embryo, consistent with failed pregnancy for twin A only. Close clinical and consideration of short-term ultrasound follow-up recommended. Electronically Signed   By: Richardean Sale M.D.   On: 12/14/2017 08:27   US Ob Comp Addl Gest Less 14 Wks  Result Date: 12/14/2017 CLINICAL DATA:  Abdominal pain for 3 days. First trimester pregnancy. LMP 10/25/2017. EXAM: TWIN OBSTETRIC <14WK [place] AND TRANSVAGINAL OB US COMPARISON:  Obstetric ultrasound 02/10/2016 FINDINGS: Study is limited by body habitus and bowel gas. The ovaries were not visualized. Number of IUPs: There is a dominant gestational sac which contains an embryo, labeled twin B. Adjacent to this is a smaller rounded fluid collection with echogenic margins which likely represents a 2nd intrauterine gestational sac (likely dichorionic- diamniotic). No embryo is seen within this 2nd gestational sac which is labeled twin A. TWIN A Yolk sac:  Not visualized Embryo:  Not visualized Cardiac Activity: Not visualized MSD: 12.5 mm TWIN B Yolk sac:  Visualized Embryo:  Visualized Cardiac Activity: Present Heart Rate: 155 bpm CRL:  10.8 mm   7 w 1 d                  [place] EDC: 08/01/2018 Subchorionic hemorrhage:  None visualized. Maternal uterus/adnexae: The ovaries are not visualized. No adnexal abnormality identified. IMPRESSION: Suspected twin pregnancy. Only 1 viable embryo is visualized (labeled twin B), with a best estimated gestational age of [redacted] weeks 1 day based on crown-rump length. The adjacent smaller gestational sac demonstrates no viable embryo, consistent with failed pregnancy for twin A only. Close clinical and consideration of short-term ultrasound follow-up recommended. Electronically Signed   By: Richardean Sale M.D.   On: 12/14/2017 08:27   US Ob Transvaginal  Result  Date: 12/14/2017 CLINICAL DATA:  Abdominal pain for 3 days. First trimester pregnancy. LMP 10/25/2017. EXAM: TWIN OBSTETRIC <14WK [place] AND TRANSVAGINAL OB US COMPARISON:  Obstetric ultrasound 02/10/2016 FINDINGS: Study is limited by body habitus and bowel gas. The ovaries were not visualized. Number of IUPs: There is a  dominant gestational sac which contains an embryo, labeled twin B. Adjacent to this is a smaller rounded fluid collection with echogenic margins which likely represents a 2nd intrauterine gestational sac (likely dichorionic- diamniotic). No embryo is seen within this 2nd gestational sac which is labeled twin A. TWIN A Yolk sac:  Not visualized Embryo:  Not visualized Cardiac Activity: Not visualized MSD: 12.5 mm TWIN B Yolk sac:  Visualized Embryo:  Visualized Cardiac Activity: Present Heart Rate: 155 bpm CRL:  10.8 mm   7 w 1 d                  [place] EDC: 08/01/2018 Subchorionic hemorrhage:  None visualized. Maternal uterus/adnexae: The ovaries are not visualized. No adnexal abnormality identified. IMPRESSION: Suspected twin pregnancy. Only 1 viable embryo is visualized (labeled twin B), with a best estimated gestational age of [redacted] weeks 1 day based on crown-rump length. The adjacent smaller gestational sac demonstrates no viable embryo, consistent with failed pregnancy for twin A only. Close clinical and consideration of short-term ultrasound follow-up recommended. Electronically Signed   By: Richardean Sale M.D.   On: 12/14/2017 08:27    Procedures Procedures (including critical care time)  Medications Ordered in ED Medications  ondansetron (ZOFRAN) injection 4 mg (has no administration in time range)  ondansetron (ZOFRAN-ODT) disintegrating tablet 4 mg (4 mg Oral Given 12/14/17 0339)  famotidine (PEPCID) IVPB 20 mg premix (0 mg Intravenous Stopped 12/14/17 0812)     Initial Impression / Assessment and Plan / ED Course  I have reviewed the triage vital signs and the nursing notes.  Pertinent  labs & imaging results that were available during my care of the patient were reviewed by me and considered in my medical decision making (see chart for details).     42 year old female with past medical history of hypertension, presents to ED for evaluation of 3 to 4-day history of abdominal bloating, epigastric discomfort, several episodes of nonbloody, nonbilious emesis daily.  No improvement with meclizine.  Denies any urinary symptoms, changes in bowel movements.  On physical exam there is epigastric tenderness to palpation with no rebound or guarding noted.  Lab work significant for leukocytosis at 12.5, i-STAT hCG greater than 2000.  Lipase, urinalysis, CMP unremarkable.  Pelvic ultrasound shows twin pregnancy with unviable twin A but viable twin B.  Patient does report having a miscarriage with twins in 2017.  Recommend follow-up ultrasound and follow-up with OB for further evaluation.  Patient was given Pepcid, fluids with significant improvement in her symptoms.  She is able to tolerate p.o. intake without difficulty prior to discharge.  Will discharge home with antacids, Tylenol and OB follow-up.  Advised to return to ED for any severe worsening symptoms.  Portions of this note were generated with [employer]. Dictation errors may occur despite best attempts at proofreading.   Final Clinical Impressions(s) / ED Diagnoses   Final diagnoses:  Less than [redacted] weeks gestation of pregnancy    ED Discharge Orders        Ordered    famotidine (PEPCID) 20 MG tablet  2 times daily     12/14/17 1004    acetaminophen (TYLENOL) 325 MG tablet  Every 6 hours PRN     12/14/17 1004       Delia Heady, PA-C 12/14/17 1005    Molpus, John, MD 12/20/17 2313

## 2017-12-14 NOTE — ED Triage Notes (Signed)
Pt reports 3-4 days of abdominal bloating, vomiting,  and abdominal pain that radiates to her back. LBM tonight, reports as normal.

## 2017-12-14 NOTE — Discharge Instructions (Addendum)
Please discontinue use of any alcohol as you are pregnant.  Do not take any NSAIDs such as ibuprofen or Aleve. Take the medications prescribed as needed. Follow-up with your OB/GYN for further evaluation. Return to ED for worsening symptoms, vaginal bleeding, increased vomiting, lightheadedness, loss of consciousness.

## 2018-08-18 ENCOUNTER — Other Ambulatory Visit: Payer: Self-pay | Admitting: Neurosurgery

## 2018-10-27 NOTE — Pre-Procedure Instructions (Addendum)
[id number] [name], [place] [address] [place] [address] Phone: [phone] Fax: [phone]      Your procedure is scheduled on Fri 10-31-18  Report to [facility] Main Entrance "A" at 0530A.M., and check in at the Admitting office.  Call this number if you have problems the morning of surgery:  [phone]  Call [phone] if you have any questions prior to your surgery date Monday-Friday 8am-4pm    Remember:  Do not eat or drink after midnight.   Take these medicines the morning of surgery with A SIP OF WATER :metoprolol succinate (TOPROL-XL) ,Prozac,Brexpiprazole (REXULTI), Vyvanse  7 days prior to surgery STOP taking any Aspirin (unless otherwise instructed by your surgeon), Aleve, Naproxen, Ibuprofen, Motrin, Advil, Goody's, BC's, all herbal medications, fish oil, and all vitamins.    The Morning of Surgery  Do not wear jewelry, make-up or nail polish.  Do not wear lotions, powders, or perfumes/colognes, or deodorant  Do not shave 48 hours prior to surgery.  Men may shave face and neck.  Do not bring valuables to the hospital.  [facility] is not responsible for any belongings or valuables.  If you are a smoker, DO NOT Smoke 24 hours prior to surgery IF you wear a CPAP at night please bring your mask, tubing, and machine the morning of surgery   Remember that you must have someone to transport you home after your surgery, and remain with you for 24 hours if you are discharged the same day.   Contacts, glasses, hearing aids, dentures or bridgework may not be worn into surgery.    Leave your suitcase in the car.  After surgery it may be brought to your room.  For patients admitted to the hospital, discharge time will be determined by your treatment team.  Patients discharged the day of surgery will not be allowed to drive home.    Special instructions:   [facility]- Preparing For Surgery  Before surgery, you can play an important role. Because skin is not sterile, your skin needs to be as free of germs as possible. You can reduce the number of germs on your skin by washing with CHG (chlorahexidine gluconate) Soap before surgery.  CHG is an antiseptic cleaner which kills germs and bonds with the skin to continue killing germs even after washing.    Oral Hygiene is also important to reduce your risk of infection.  Remember - BRUSH YOUR TEETH THE MORNING OF SURGERY WITH YOUR REGULAR TOOTHPASTE  Please do not use if you have an allergy to CHG or antibacterial soaps. If your skin becomes reddened/irritated stop using the CHG.  Do not shave (including legs and underarms) for at least 48 hours prior to first CHG shower. It is OK to shave your face.  Please follow these instructions carefully.   1. Shower the NIGHT BEFORE SURGERY and the MORNING OF SURGERY with CHG Soap.   2. If you chose to wash your hair, wash your hair first as usual with your normal shampoo.  3. After you shampoo, rinse your hair and body thoroughly to remove the shampoo.  4. Use CHG as you would any other liquid soap. You can apply CHG directly to the skin and wash gently with a scrungie or a clean washcloth.   5. Apply the CHG Soap to your body ONLY FROM THE NECK DOWN.  Do not use on open wounds or  open sores. Avoid contact with your eyes, ears, mouth and genitals (private parts). Wash Face and genitals (private parts)  with your normal soap.   6. Wash thoroughly, paying special attention to the area where your surgery will be performed.  7. Thoroughly rinse your body with warm water from the neck down.  8. DO NOT shower/wash with your normal soap after using and rinsing off the CHG Soap.  9. Pat yourself dry with a CLEAN TOWEL.  10. Wear CLEAN PAJAMAS to bed the night before surgery, wear comfortable clothes the morning of surgery  11. Place CLEAN SHEETS on your bed the  night of your first shower and DO NOT SLEEP WITH PETS.    Day of Surgery:  Do not apply any deodorants/lotions.  Please wear clean clothes to the hospital/surgery center.   Remember to brush your teeth WITH YOUR REGULAR TOOTHPASTE.   Please read over the following fact sheets that you were given.

## 2018-10-28 ENCOUNTER — Encounter (HOSPITAL_COMMUNITY): Payer: Self-pay

## 2018-10-28 ENCOUNTER — Encounter (HOSPITAL_COMMUNITY)
Admission: RE | Admit: 2018-10-28 | Discharge: 2018-10-28 | Disposition: A | Discharge status: Home / Self Care | Payer: Medicaid Other | Source: Ambulatory Visit | Attending: Neurosurgery | Admitting: Neurosurgery

## 2018-10-28 ENCOUNTER — Other Ambulatory Visit: Payer: Self-pay

## 2018-10-28 ENCOUNTER — Other Ambulatory Visit (HOSPITAL_COMMUNITY)
Admission: RE | Admit: 2018-10-28 | Discharge: 2018-10-28 | Disposition: A | Discharge status: Home / Self Care | Payer: Medicaid Other | Source: Ambulatory Visit | Attending: Neurosurgery | Admitting: Neurosurgery

## 2018-10-28 DIAGNOSIS — Z01818 Encounter for other preprocedural examination: Secondary | ICD-10-CM | POA: Insufficient documentation

## 2018-10-28 HISTORY — DX: Other specified postprocedural states: Z98.890

## 2018-10-28 HISTORY — DX: Other specified postprocedural states: R11.2

## 2018-10-28 LAB — CBC
HCT: 37.2 % (ref 36.0–46.0)
Hemoglobin: 11.6 g/dL — ABNORMAL LOW (ref 12.0–15.0)
MCH: 27 pg (ref 26.0–34.0)
MCHC: 31.2 g/dL (ref 30.0–36.0)
MCV: 86.5 fL (ref 80.0–100.0)
Platelets: 193 10*3/uL (ref 150–400)
RBC: 4.3 MIL/uL (ref 3.87–5.11)
RDW: 14.6 % (ref 11.5–15.5)
WBC: 5.4 10*3/uL (ref 4.0–10.5)
nRBC: 0 % (ref 0.0–0.2)

## 2018-10-28 LAB — BASIC METABOLIC PANEL
Anion gap: 7 (ref 5–15)
BUN: 11 mg/dL (ref 6–20)
CO2: 29 mmol/L (ref 22–32)
Calcium: 9.4 mg/dL (ref 8.9–10.3)
Chloride: 103 mmol/L (ref 98–111)
Creatinine, Ser: 0.88 mg/dL (ref 0.44–1.00)
GFR calc Af Amer: >60 mL/min (ref >60)
GFR calc non Af Amer: >60 mL/min (ref >60)
Glucose, Bld: 122 mg/dL — ABNORMAL HIGH (ref 70–99)
Potassium: 3.5 mmol/L (ref 3.5–5.1)
Sodium: 139 mmol/L (ref 135–145)

## 2018-10-28 LAB — SURGICAL PCR SCREEN
MRSA, PCR: NEGATIVE
Staphylococcus aureus: POSITIVE — AB

## 2018-10-28 NOTE — Progress Notes (Addendum)
  Coronavirus Screening  Have you experienced the following symptoms:  Cough yes/no: No Fever (>100.52F)  yes/no: No Runny nose yes/no: No Sore throat yes/no: No Difficulty breathing/shortness of breath  yes/no: No Loss of smell or taste-No Have you or a family member traveled in the last 14 days and where? yes/no: No   [facility]  Cardiologist - denies  Chest x-ray - NA  EKG - 10-28-18  Stress Test - more than 10 yrs back.  ECHO - 11.11.16  Cardiac Cath - denies  AICD-denies PM-denies LOOP-denies  Sleep Study - N CPAP - NA  LABS-PCR,CBC,BMP  ASA-denies  ERAS-NA  HA1C-NA Fasting Blood Sugar -  Checks Blood Sugar _____ times a day  Anesthesia-N  Pt denies having chest pain, sob, or fever at this time. All instructions explained to the pt, with a verbal understanding of the material. Pt agrees to go over the instructions while at home for a better understanding. The opportunity to ask questions was provided.

## 2018-10-29 LAB — NOVEL CORONAVIRUS, NAA (HOSP ORDER, SEND-OUT TO REF LAB; TAT 18-24 HRS): SARS-CoV-2, NAA: NOT DETECTED

## 2018-10-30 MED ORDER — DEXTROSE 5 % IV SOLN
3.0000 g | INTRAVENOUS | Status: AC
  Filled 2018-10-30 (×2): qty 3000
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
## 2018-11-01 ENCOUNTER — Other Ambulatory Visit (HOSPITAL_COMMUNITY)
Admission: RE | Admit: 2018-11-01 | Discharge: 2018-11-01 | Disposition: A | Discharge status: Home / Self Care | Payer: Medicaid Other | Source: Ambulatory Visit | Attending: Neurosurgery | Admitting: Neurosurgery

## 2018-11-01 DIAGNOSIS — Z1159 Encounter for screening for other viral diseases: Secondary | ICD-10-CM | POA: Insufficient documentation

## 2018-11-01 LAB — SARS CORONAVIRUS 2 (TAT 6-24 HRS): SARS Coronavirus 2: NEGATIVE

## 2018-11-13 ENCOUNTER — Encounter (HOSPITAL_COMMUNITY): Payer: Self-pay

## 2018-11-13 NOTE — Pre-Procedure Instructions (Signed)
Suzanne Andrade  11/13/2018    Your procedure is scheduled on Friday, November 21, 2018 at 7:30 AM.   Report to [facility] Entrance "A" Admitting Office at 5:30 AM.   Call this number if you have problems the morning of surgery: [phone]   Questions prior to day of surgery, please call [phone] between 8 & 4 PM.   Remember:  Do not eat or drink after midnight Thursday, 11/20/18.  Take these medicines the morning of surgery with A SIP OF WATER: Brexiprazole (Rexulti), Fluoxetine (Prozac), Metoprolol (Toprol SL)  Stop Herbal medications as of today prior to surgery. Do not use NSAIDS (Ibuprofen, Aleve, etc), Aspirin products (BC Powders, Goody's, etc.) or Multivitamins prior to surgery.    Do not wear jewelry, make-up or nail polish.  Do not wear lotions, powders, perfumes or deodorant.  Do not shave 48 hours prior to surgery.   Do not bring valuables to the hospital.  [facility] is not responsible for any belongings or valuables.  Contacts, dentures or bridgework may not be worn into surgery.  Leave your suitcase in the car.  After surgery it may be brought to your room.  For patients admitted to the hospital, discharge time will be determined by your treatment team.  Patients discharged the day of surgery will not be allowed to drive home.   [place] - Preparing for Surgery  Before surgery, you can play an important role.  Because skin is not sterile, your skin needs to be as free of germs as possible.  You can reduce the number of germs on you skin by washing with CHG (chlorahexidine gluconate) soap before surgery.  CHG is an antiseptic cleaner which kills germs and bonds with the skin to continue killing germs even after washing.  Oral Hygiene is also important in reducing the risk of infection.  Remember to brush your teeth with your regular toothpaste the morning of surgery.  Please DO NOT use if you have an allergy to CHG or antibacterial soaps.  If your skin  becomes reddened/irritated stop using the CHG and inform your nurse when you arrive at Short Stay.  Do not shave (including legs and underarms) for at least 48 hours prior to the first CHG shower.  You may shave your face.  Please follow these instructions carefully:   1.  Shower with CHG Soap the night before surgery and the morning of Surgery.  2.  If you choose to wash your hair, wash your hair first as usual with your normal shampoo.  3.  After you shampoo, rinse your hair and body thoroughly to remove the shampoo. 4.  Use CHG as you would any other liquid soap.  You can apply chg directly to the skin and wash gently with a      scrungie or washcloth.           5.  Apply the CHG Soap to your body ONLY FROM THE NECK DOWN.   Do not use on open wounds or open sores. Avoid contact with your eyes, ears, mouth and genitals (private parts).  Wash genitals (private parts) with your normal soap.  6.  Wash thoroughly, paying special attention to the area where your surgery will be performed.  7.  Thoroughly rinse your body with warm water from the neck down.  8.  DO NOT shower/wash with your normal soap after using and rinsing off the CHG Soap.  9.  Pat yourself dry with a clean towel.  10.  Wear clean pajamas.            11.  Place clean sheets on your bed the night of your first shower and do not sleep with pets.  Day of Surgery  Shower as above.  Do not apply any lotions/deodorants the morning of surgery.   Please wear clean clothes to the hospital. Remember to brush your teeth with toothpaste.   Please read over the fact sheets that you were given.

## 2018-11-17 ENCOUNTER — Encounter (HOSPITAL_COMMUNITY)
Admission: RE | Admit: 2018-11-17 | Discharge: 2018-11-17 | Disposition: A | Discharge status: Home / Self Care | Payer: Medicaid Other | Source: Ambulatory Visit | Attending: Neurosurgery | Admitting: Neurosurgery

## 2018-11-17 ENCOUNTER — Other Ambulatory Visit: Payer: Self-pay

## 2018-11-17 ENCOUNTER — Encounter (HOSPITAL_COMMUNITY): Payer: Self-pay

## 2018-11-17 DIAGNOSIS — Z01812 Encounter for preprocedural laboratory examination: Secondary | ICD-10-CM | POA: Insufficient documentation

## 2018-11-17 LAB — BASIC METABOLIC PANEL
Anion gap: 8 (ref 5–15)
BUN: 10 mg/dL (ref 6–20)
CO2: 28 mmol/L (ref 22–32)
Calcium: 9.4 mg/dL (ref 8.9–10.3)
Chloride: 102 mmol/L (ref 98–111)
Creatinine, Ser: 0.71 mg/dL (ref 0.44–1.00)
GFR calc Af Amer: >60 mL/min (ref >60)
GFR calc non Af Amer: >60 mL/min (ref >60)
Glucose, Bld: 121 mg/dL — ABNORMAL HIGH (ref 70–99)
Potassium: 3.7 mmol/L (ref 3.5–5.1)
Sodium: 138 mmol/L (ref 135–145)

## 2018-11-17 LAB — CBC
HCT: 37 % (ref 36.0–46.0)
Hemoglobin: 11.6 g/dL — ABNORMAL LOW (ref 12.0–15.0)
MCH: 26.7 pg (ref 26.0–34.0)
MCHC: 31.4 g/dL (ref 30.0–36.0)
MCV: 85.3 fL (ref 80.0–100.0)
Platelets: 188 10*3/uL (ref 150–400)
RBC: 4.34 MIL/uL (ref 3.87–5.11)
RDW: 14.7 % (ref 11.5–15.5)
WBC: 6.9 10*3/uL (ref 4.0–10.5)
nRBC: 0 % (ref 0.0–0.2)

## 2018-11-17 NOTE — Progress Notes (Signed)
PCP - Dr. Loma Sousa, [facility] Cardiologist - denies  Chest x-ray - n/a EKG - 10/28/18 Stress Test - n/a ECHO - 03/25/15 Cardiac Cath - n/a  Sleep Study - denies CPAP -   Fasting Blood Sugar - n/a Checks Blood Sugar _____ times a day  Blood Thinner Instructions: n/a Aspirin Instructions: n/a  Anesthesia review: no  Patient denies shortness of breath, fever, cough and chest pain at PAT appointment   Patient verbalized understanding of instructions that were given to them at the PAT appointment. Patient was also instructed that they will need to review over the PAT instructions again at home before surgery.  Pt has Covid 19 test scheduled for 11/18/18. Pt instructed on need for quarantine. Pt voiced understanding.   Coronavirus Screening  Have you experienced the following symptoms:  Cough NO Fever (>100.25F)  NO Runny nose NO Sore throat NO Difficulty breathing/shortness of breath  NO  Have you or a family member traveled in the last 14 days and where?   If the patient indicates "YES" to the above questions, their PAT will be rescheduled to limit the exposure to others and, the surgeon will be notified. THE PATIENT WILL NEED TO BE ASYMPTOMATIC FOR 14 DAYS.   If the patient is not experiencing any of these symptoms, the PAT nurse will instruct them to NOT bring anyone with them to their appointment since they may have these symptoms or traveled as well.   Patient reminded that hospital visitation restrictions are in effect and the importance of the restrictions.

## 2018-11-18 ENCOUNTER — Other Ambulatory Visit (HOSPITAL_COMMUNITY)
Admission: RE | Admit: 2018-11-18 | Discharge: 2018-11-18 | Disposition: A | Discharge status: Home / Self Care | Payer: Medicaid Other | Source: Ambulatory Visit | Attending: Neurosurgery | Admitting: Neurosurgery

## 2018-11-18 DIAGNOSIS — Z01812 Encounter for preprocedural laboratory examination: Secondary | ICD-10-CM | POA: Insufficient documentation

## 2018-11-18 DIAGNOSIS — Z1159 Encounter for screening for other viral diseases: Secondary | ICD-10-CM | POA: Insufficient documentation

## 2018-11-18 LAB — SARS CORONAVIRUS 2 (TAT 6-24 HRS): SARS Coronavirus 2: NEGATIVE

## 2018-11-21 ENCOUNTER — Ambulatory Visit (HOSPITAL_COMMUNITY): Payer: Medicaid Other | Admitting: Anesthesiology

## 2018-11-21 ENCOUNTER — Other Ambulatory Visit: Payer: Self-pay

## 2018-11-21 ENCOUNTER — Ambulatory Visit (HOSPITAL_COMMUNITY): Payer: Medicaid Other

## 2018-11-21 ENCOUNTER — Ambulatory Visit (HOSPITAL_COMMUNITY)
Admission: RE | Admit: 2018-11-21 | Discharge: 2018-11-21 | Disposition: A | Discharge status: Home / Self Care | Payer: Medicaid Other | Attending: Neurosurgery | Admitting: Neurosurgery

## 2018-11-21 ENCOUNTER — Encounter (HOSPITAL_COMMUNITY)
Admission: RE | Disposition: A | Discharge status: Home / Self Care | Payer: Self-pay | Source: Home / Self Care | Attending: Neurosurgery

## 2018-11-21 ENCOUNTER — Encounter (HOSPITAL_COMMUNITY): Payer: Self-pay | Admitting: *Deleted

## 2018-11-21 DIAGNOSIS — Z79899 Other long term (current) drug therapy: Secondary | ICD-10-CM | POA: Insufficient documentation

## 2018-11-21 DIAGNOSIS — F419 Anxiety disorder, unspecified: Secondary | ICD-10-CM | POA: Insufficient documentation

## 2018-11-21 DIAGNOSIS — I10 Essential (primary) hypertension: Secondary | ICD-10-CM | POA: Diagnosis not present

## 2018-11-21 DIAGNOSIS — M79605 Pain in left leg: Secondary | ICD-10-CM | POA: Diagnosis not present

## 2018-11-21 DIAGNOSIS — Z419 Encounter for procedure for purposes other than remedying health state, unspecified: Secondary | ICD-10-CM

## 2018-11-21 DIAGNOSIS — F329 Major depressive disorder, single episode, unspecified: Secondary | ICD-10-CM | POA: Insufficient documentation

## 2018-11-21 DIAGNOSIS — M5127 Other intervertebral disc displacement, lumbosacral region: Secondary | ICD-10-CM | POA: Diagnosis present

## 2018-11-21 HISTORY — PX: LUMBAR LAMINECTOMY/DECOMPRESSION MICRODISCECTOMY: SHX5026

## 2018-11-21 LAB — POCT PREGNANCY, URINE: Preg Test, Ur: NEGATIVE

## 2018-11-21 SURGERY — LUMBAR LAMINECTOMY/DECOMPRESSION MICRODISCECTOMY 1 LEVEL
Anesthesia: General | Site: Back | Laterality: Left

## 2018-11-21 MED ORDER — DEXTROSE 5 % IV SOLN
INTRAVENOUS | Status: DC | PRN
  Administered 2018-11-21: 3 g via INTRAVENOUS

## 2018-11-21 MED ORDER — MEPERIDINE HCL 25 MG/ML IJ SOLN: 6.2500 mg | INTRAMUSCULAR | Status: DC | PRN

## 2018-11-21 MED ORDER — CYCLOBENZAPRINE HCL 10 MG PO TABS: 10.0000 mg | ORAL_TABLET | Freq: Two times a day (BID) | ORAL | 0 refills | Status: DC | PRN

## 2018-11-21 MED ORDER — FENTANYL CITRATE (PF) 250 MCG/5ML IJ SOLN
INTRAMUSCULAR | Status: AC
  Filled 2018-11-21: qty 5

## 2018-11-21 MED ORDER — PROMETHAZINE HCL 25 MG/ML IJ SOLN
INTRAMUSCULAR | Status: AC
  Filled 2018-11-21: qty 1

## 2018-11-21 MED ORDER — LIDOCAINE 2% (20 MG/ML) 5 ML SYRINGE
INTRAMUSCULAR | Status: DC | PRN
  Administered 2018-11-21: 100 mg via INTRAVENOUS

## 2018-11-21 MED ORDER — EPHEDRINE SULFATE 50 MG/ML IJ SOLN
INTRAMUSCULAR | Status: DC | PRN
  Administered 2018-11-21 (×2): 5 mg via INTRAVENOUS

## 2018-11-21 MED ORDER — CHLORHEXIDINE GLUCONATE CLOTH 2 % EX PADS: 6.0000 | MEDICATED_PAD | Freq: Once | CUTANEOUS | Status: DC

## 2018-11-21 MED ORDER — LIDOCAINE-EPINEPHRINE 1 %-1:100000 IJ SOLN
INTRAMUSCULAR | Status: DC | PRN
  Administered 2018-11-21: 5 mL

## 2018-11-21 MED ORDER — ONDANSETRON HCL 4 MG/2ML IJ SOLN
4.0000 mg | Freq: Once | INTRAMUSCULAR | Status: AC
  Administered 2018-11-21: 4 mg via INTRAVENOUS

## 2018-11-21 MED ORDER — CEFAZOLIN SODIUM-DEXTROSE 2-4 GM/100ML-% IV SOLN
INTRAVENOUS | Status: AC
  Filled 2018-11-21: qty 100

## 2018-11-21 MED ORDER — SUGAMMADEX SODIUM 500 MG/5ML IV SOLN
INTRAVENOUS | Status: DC | PRN
  Administered 2018-11-21: 550 mg via INTRAVENOUS

## 2018-11-21 MED ORDER — METHYLPREDNISOLONE ACETATE 80 MG/ML IJ SUSP
INTRAMUSCULAR | Status: DC | PRN
  Administered 2018-11-21: 80 mg

## 2018-11-21 MED ORDER — ONDANSETRON HCL 4 MG/2ML IJ SOLN
INTRAMUSCULAR | Status: DC | PRN
  Administered 2018-11-21: 4 mg via INTRAVENOUS

## 2018-11-21 MED ORDER — THROMBIN 5000 UNITS EX SOLR
OROMUCOSAL | Status: DC | PRN
  Administered 2018-11-21: 5 mL via TOPICAL

## 2018-11-21 MED ORDER — DEXAMETHASONE SODIUM PHOSPHATE 10 MG/ML IJ SOLN
INTRAMUSCULAR | Status: DC | PRN
  Administered 2018-11-21: 10 mg via INTRAVENOUS

## 2018-11-21 MED ORDER — PROMETHAZINE HCL 25 MG/ML IJ SOLN
6.2500 mg | INTRAMUSCULAR | Status: DC | PRN
  Administered 2018-11-21: 11:00:00 12.5 mg via INTRAVENOUS

## 2018-11-21 MED ORDER — PHENYLEPHRINE HCL (PRESSORS) 10 MG/ML IV SOLN
INTRAVENOUS | Status: DC | PRN
  Administered 2018-11-21: 80 ug via INTRAVENOUS

## 2018-11-21 MED ORDER — METHYLPREDNISOLONE ACETATE 80 MG/ML IJ SUSP
INTRAMUSCULAR | Status: AC
  Filled 2018-11-21: qty 1

## 2018-11-21 MED ORDER — MIDAZOLAM HCL 5 MG/5ML IJ SOLN
INTRAMUSCULAR | Status: DC | PRN
  Administered 2018-11-21: 2 mg via INTRAVENOUS

## 2018-11-21 MED ORDER — HYDROMORPHONE HCL 1 MG/ML IJ SOLN
INTRAMUSCULAR | Status: AC
  Filled 2018-11-21: qty 1

## 2018-11-21 MED ORDER — BUPIVACAINE HCL (PF) 0.5 % IJ SOLN
INTRAMUSCULAR | Status: DC | PRN
  Administered 2018-11-21: 5 mL

## 2018-11-21 MED ORDER — BUPIVACAINE HCL (PF) 0.5 % IJ SOLN
INTRAMUSCULAR | Status: AC
  Filled 2018-11-21: qty 30

## 2018-11-21 MED ORDER — MIDAZOLAM HCL 2 MG/2ML IJ SOLN
INTRAMUSCULAR | Status: AC
  Filled 2018-11-21: qty 2

## 2018-11-21 MED ORDER — THROMBIN 5000 UNITS EX SOLR
CUTANEOUS | Status: DC | PRN
  Administered 2018-11-21 (×2): 5000 [IU] via TOPICAL

## 2018-11-21 MED ORDER — OXYCODONE-ACETAMINOPHEN 10-325 MG PO TABS: 1.0000 | ORAL_TABLET | Freq: Four times a day (QID) | ORAL | 0 refills | Status: DC | PRN

## 2018-11-21 MED ORDER — HEMOSTATIC AGENTS (NO CHARGE) OPTIME
TOPICAL | Status: DC | PRN
  Administered 2018-11-21: 1 via TOPICAL

## 2018-11-21 MED ORDER — SODIUM CHLORIDE 0.9 % IV SOLN
INTRAVENOUS | Status: DC | PRN
  Administered 2018-11-21: 09:00:00 500 mL

## 2018-11-21 MED ORDER — HYDROMORPHONE HCL 1 MG/ML IJ SOLN
0.2500 mg | INTRAMUSCULAR | Status: DC | PRN
  Administered 2018-11-21 (×4): 0.5 mg via INTRAVENOUS

## 2018-11-21 MED ORDER — LIDOCAINE-EPINEPHRINE 1 %-1:100000 IJ SOLN
INTRAMUSCULAR | Status: AC
  Filled 2018-11-21: qty 1

## 2018-11-21 MED ORDER — 0.9 % SODIUM CHLORIDE (POUR BTL) OPTIME
TOPICAL | Status: DC | PRN
  Administered 2018-11-21: 09:00:00 1000 mL

## 2018-11-21 MED ORDER — ROCURONIUM BROMIDE 50 MG/5ML IV SOSY
PREFILLED_SYRINGE | INTRAVENOUS | Status: DC | PRN
  Administered 2018-11-21: 60 mg via INTRAVENOUS

## 2018-11-21 MED ORDER — PROPOFOL 10 MG/ML IV BOLUS
INTRAVENOUS | Status: DC | PRN
  Administered 2018-11-21: 200 mg via INTRAVENOUS

## 2018-11-21 MED ORDER — THROMBIN 5000 UNITS EX SOLR
CUTANEOUS | Status: AC
  Filled 2018-11-21: qty 15000

## 2018-11-21 MED ORDER — FENTANYL CITRATE (PF) 100 MCG/2ML IJ SOLN
INTRAMUSCULAR | Status: DC | PRN
  Administered 2018-11-21: 50 ug via INTRAVENOUS
  Administered 2018-11-21: 100 ug via INTRAVENOUS
  Administered 2018-11-21 (×2): 50 ug via INTRAVENOUS

## 2018-11-21 MED ORDER — ONDANSETRON HCL 4 MG/2ML IJ SOLN
INTRAMUSCULAR | Status: AC
  Filled 2018-11-21: qty 2

## 2018-11-21 MED ORDER — LACTATED RINGERS IV SOLN
INTRAVENOUS | Status: DC | PRN
  Administered 2018-11-21 (×2): via INTRAVENOUS

## 2018-11-21 MED ORDER — PROPOFOL 10 MG/ML IV BOLUS
INTRAVENOUS | Status: AC
  Filled 2018-11-21: qty 20

## 2018-11-21 SURGICAL SUPPLY — 66 items
BAG DECANTER FOR FLEXI CONT (MISCELLANEOUS) ×3 IMPLANT
BENZOIN TINCTURE PRP APPL 2/3 (GAUZE/BANDAGES/DRESSINGS) IMPLANT
BLADE CLIPPER SURG (BLADE) IMPLANT
BLADE SURG 11 STRL SS (BLADE) ×3 IMPLANT
BUR MATCHSTICK NEURO 3.0 LAGG (BURR) ×3 IMPLANT
BUR PRECISION FLUTE 5.0 (BURR) ×3 IMPLANT
CANISTER SUCT 3000ML PPV (MISCELLANEOUS) ×3 IMPLANT
CARTRIDGE OIL MAESTRO DRILL (MISCELLANEOUS) ×1 IMPLANT
CLOSURE WOUND 1/2 X4 (GAUZE/BANDAGES/DRESSINGS)
COVER WAND RF STERILE (DRAPES) ×3 IMPLANT
DECANTER SPIKE VIAL GLASS SM (MISCELLANEOUS) ×3 IMPLANT
DERMABOND ADVANCED (GAUZE/BANDAGES/DRESSINGS) ×2
DERMABOND ADVANCED .7 DNX12 (GAUZE/BANDAGES/DRESSINGS) ×1 IMPLANT
DIFFUSER DRILL AIR PNEUMATIC (MISCELLANEOUS) ×3 IMPLANT
DRAPE LAPAROTOMY 100X72X124 (DRAPES) ×3 IMPLANT
DRAPE MICROSCOPE LEICA (MISCELLANEOUS) ×3 IMPLANT
DRAPE SURG 17X23 STRL (DRAPES) ×3 IMPLANT
DRSG OPSITE POSTOP 3X4 (GAUZE/BANDAGES/DRESSINGS) IMPLANT
DRSG OPSITE POSTOP 4X6 (GAUZE/BANDAGES/DRESSINGS) ×3 IMPLANT
DURAPREP 26ML APPLICATOR (WOUND CARE) ×3 IMPLANT
ELECT BLADE 4.0 EZ CLEAN MEGAD (MISCELLANEOUS) ×3
ELECT REM PT RETURN 9FT ADLT (ELECTROSURGICAL) ×3
ELECTRODE BLDE 4.0 EZ CLN MEGD (MISCELLANEOUS) ×1 IMPLANT
ELECTRODE REM PT RTRN 9FT ADLT (ELECTROSURGICAL) ×1 IMPLANT
GAUZE 4X4 16PLY RFD (DISPOSABLE) IMPLANT
GAUZE SPONGE 4X4 12PLY STRL (GAUZE/BANDAGES/DRESSINGS) IMPLANT
GLOVE BIO SURGEON STRL SZ7 (GLOVE) IMPLANT
GLOVE BIO SURGEON STRL SZ7.5 (GLOVE) ×6 IMPLANT
GLOVE BIOGEL PI IND STRL 7.0 (GLOVE) ×2 IMPLANT
GLOVE BIOGEL PI IND STRL 7.5 (GLOVE) ×4 IMPLANT
GLOVE BIOGEL PI IND STRL 8 (GLOVE) ×2 IMPLANT
GLOVE BIOGEL PI INDICATOR 7.0 (GLOVE) ×4
GLOVE BIOGEL PI INDICATOR 7.5 (GLOVE) ×8
GLOVE BIOGEL PI INDICATOR 8 (GLOVE) ×4
GLOVE ECLIPSE 7.0 STRL STRAW (GLOVE) ×3 IMPLANT
GLOVE ECLIPSE 7.5 STRL STRAW (GLOVE) ×6 IMPLANT
GLOVE EXAM NITRILE XL STR (GLOVE) IMPLANT
GLOVE SURG SS PI 7.5 STRL IVOR (GLOVE) ×15 IMPLANT
GOWN STRL REUS W/ TWL LRG LVL3 (GOWN DISPOSABLE) ×3 IMPLANT
GOWN STRL REUS W/ TWL XL LVL3 (GOWN DISPOSABLE) ×4 IMPLANT
GOWN STRL REUS W/TWL 2XL LVL3 (GOWN DISPOSABLE) ×3 IMPLANT
GOWN STRL REUS W/TWL LRG LVL3 (GOWN DISPOSABLE) ×6
GOWN STRL REUS W/TWL XL LVL3 (GOWN DISPOSABLE) ×8
HEMOSTAT POWDER KIT SURGIFOAM (HEMOSTASIS) ×3 IMPLANT
KIT BASIN OR (CUSTOM PROCEDURE TRAY) ×3 IMPLANT
KIT TURNOVER KIT B (KITS) ×3 IMPLANT
NEEDLE HYPO 18GX1.5 BLUNT FILL (NEEDLE) IMPLANT
NEEDLE HYPO 22GX1.5 SAFETY (NEEDLE) ×3 IMPLANT
NEEDLE SPNL 18GX3.5 QUINCKE PK (NEEDLE) ×3 IMPLANT
NS IRRIG 1000ML POUR BTL (IV SOLUTION) ×3 IMPLANT
OIL CARTRIDGE MAESTRO DRILL (MISCELLANEOUS) ×3
PACK LAMINECTOMY NEURO (CUSTOM PROCEDURE TRAY) ×3 IMPLANT
PAD ARMBOARD 7.5X6 YLW CONV (MISCELLANEOUS) ×9 IMPLANT
RUBBERBAND STERILE (MISCELLANEOUS) ×6 IMPLANT
SET WALTER ACTIVATION W/DRAPE (SET/KITS/TRAYS/PACK) ×3 IMPLANT
SPONGE LAP 4X18 RFD (DISPOSABLE) IMPLANT
SPONGE SURGIFOAM ABS GEL SZ50 (HEMOSTASIS) ×3 IMPLANT
STRIP CLOSURE SKIN 1/2X4 (GAUZE/BANDAGES/DRESSINGS) IMPLANT
SUT VIC AB 0 CT1 18XCR BRD8 (SUTURE) ×2 IMPLANT
SUT VIC AB 0 CT1 8-18 (SUTURE) ×4
SUT VIC AB 2-0 CT1 18 (SUTURE) ×3 IMPLANT
SUT VICRYL 3-0 RB1 18 ABS (SUTURE) ×6 IMPLANT
SYR 3ML LL SCALE MARK (SYRINGE) IMPLANT
TOWEL GREEN STERILE (TOWEL DISPOSABLE) ×3 IMPLANT
TOWEL GREEN STERILE FF (TOWEL DISPOSABLE) ×3 IMPLANT
WATER STERILE IRR 1000ML POUR (IV SOLUTION) ×3 IMPLANT
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
## 2018-11-21 NOTE — Anesthesia Procedure Notes (Signed)
Procedure Name: Intubation Date/Time: 11/21/2018 8:00 AM Performed by: Neldon Newport, CRNA Pre-anesthesia Checklist: Timeout performed, Patient being monitored, Suction available, Emergency Drugs available and Patient identified Patient Re-evaluated:Patient Re-evaluated prior to induction Oxygen Delivery Method: Circle system utilized Preoxygenation: Pre-oxygenation with 100% oxygen Induction Type: IV induction Ventilation: Mask ventilation without difficulty Laryngoscope Size: Mac and 3 Grade View: Grade II Tube type: Oral Tube size: 7.0 mm Number of attempts: 2 Placement Confirmation: breath sounds checked- equal and bilateral,  positive ETCO2 and ETT inserted through vocal cords under direct vision Secured at: 22 cm Tube secured with: Tape Dental Injury: Teeth and Oropharynx as per pre-operative assessment  Comments: Sub glottic opening to left

## 2018-11-21 NOTE — Transfer of Care (Signed)
Immediate Anesthesia Transfer of Care Note  Patient: Suzanne Andrade  Procedure(s) Performed: MICRODISCECTOMY LUMBAR FIVE- SACRAL ONE LEFT (Left Back)  Patient Location: PACU  Anesthesia Type:General  Level of Consciousness: awake, alert  and oriented  Airway & Oxygen Therapy: Patient Spontanous Breathing and Patient connected to nasal cannula oxygen  Post-op Assessment: Report given to RN, Post -op Vital signs reviewed and stable and Patient moving all extremities X 4  Post vital signs: Reviewed and stable  Last Vitals:  Vitals Value Taken Time  BP 160/89 11/21/18 1045  Temp    Pulse 80 11/21/18 1045  Resp 25 11/21/18 1045  SpO2 100 % 11/21/18 1045  Vitals shown include unvalidated device data.  Last Pain:  Vitals:   11/21/18 0544  TempSrc: Oral  PainSc: 4       Patients Stated Pain Goal: 3 (18/36/72 5500)  Complications: No apparent anesthesia complications

## 2018-11-21 NOTE — Anesthesia Preprocedure Evaluation (Addendum)
Anesthesia Evaluation  Patient identified by MRN, date of birth, ID band Patient awake    Reviewed: Allergy & Precautions, NPO status   History of Anesthesia Complications (+) PONV and history of anesthetic complications  Airway Mallampati: III  TM Distance: >3 FB Neck ROM: full    Dental  (+) Teeth Intact, Dental Advidsory Given   Pulmonary    breath sounds clear to auscultation       Cardiovascular hypertension,  Rhythm:Regular     Neuro/Psych  Headaches, PSYCHIATRIC DISORDERS Anxiety Depression    GI/Hepatic   Endo/Other    Renal/GU      Musculoskeletal   Abdominal (+) + obese,   Peds  Hematology   Anesthesia Other Findings   Reproductive/Obstetrics                            Anesthesia Physical Anesthesia Plan  ASA: III  Anesthesia Plan: General   Post-op Pain Management:    Induction: Intravenous  PONV Risk Score and Plan: 4 or greater and Ondansetron, Dexamethasone, Midazolam and Treatment may vary due to age or medical condition  Airway Management Planned: Oral ETT  Additional Equipment:   Intra-op Plan:   Post-operative Plan: Extubation in OR  Informed Consent: I have reviewed the patients History and Physical, chart, labs and discussed the procedure including the risks, benefits and alternatives for the proposed anesthesia with the patient or authorized representative who has indicated his/her understanding and acceptance.     Dental advisory given  Plan Discussed with: CRNA  Anesthesia Plan Comments:        Anesthesia Quick Evaluation

## 2018-11-21 NOTE — Discharge Summary (Signed)
Physician Discharge Summary  Patient ID: Suzanne Andrade MRN: [id number] DOB/AGE: [birth year] 43 y.o.  Admit date: 11/21/2018 Discharge date: 11/21/2018  Admission Diagnoses:  Lumbar disc herniation, L5-S1  Discharge Diagnoses:  Same Active Problems:   * No active hospital problems. *   Discharged Condition: Stable  Hospital Course:  Suzanne Andrade is a 43 y.o. female who underwent elective left sided L5 laminotomy and microdiscectomy.  Procedure was done without complication.  She was at neurologic baseline in the recovery room, with pain under control.  She was ambulatory without assistance.  She was voiding normally.  She was therefore discharged home in stable condition.  Treatments: Surgery -left L5-S1 laminotomy and microdiscectomy  Discharge Exam: Blood pressure (!) 160/89, pulse 80, temperature (!) 97.3 F (36.3 C), resp. rate (!) 25, height 5\' 3"  (1.6 m), weight 129.7 kg, last menstrual period 10/22/2018, SpO2 100 %, unknown if currently breastfeeding. Awake, alert, oriented Speech fluent, appropriate CN grossly intact 5/5 BUE/BLE Wound c/d/i  Disposition: Discharge disposition: 01-Home or Self Care       Discharge Instructions    Call MD for:  redness, tenderness, or signs of infection (pain, swelling, redness, odor or green/yellow discharge around incision site)   Complete by: As directed    Call MD for:  temperature >100.4   Complete by: As directed    Diet - low sodium heart healthy   Complete by: As directed    Discharge instructions   Complete by: As directed    Walk at home as much as possible, at least 4 times / day   Increase activity slowly   Complete by: As directed    Lifting restrictions   Complete by: As directed    No lifting > 10 lbs   May shower / Bathe   Complete by: As directed    48 hours after surgery   May walk up steps   Complete by: As directed    No dressing needed   Complete by: As directed    Other Restrictions   Complete  by: As directed    No bending/twisting at waist     Allergies as of 11/21/2018   No Known Allergies     Medication List    STOP taking these medications   acetaminophen 325 MG tablet Commonly known as: Tylenol   famotidine 20 MG tablet Commonly known as: PEPCID     TAKE these medications   cholecalciferol 25 MCG (1000 UT) tablet Commonly known as: VITAMIN D3 Take 1,000 Units by mouth 3 (three) times a week.   cyclobenzaprine 10 MG tablet Commonly known as: FLEXERIL Take 1 tablet (10 mg total) by mouth 2 (two) times daily as needed for muscle spasms.   EVENING PRIMROSE OIL PO Take 1 tablet by mouth 4 (four) times a week.   FLUoxetine 20 MG capsule Commonly known as: PROZAC Take 20 mg by mouth daily.   FLUoxetine HCl 60 MG Tabs Take 60 mg by mouth daily.   lisinopril-hydrochlorothiazide 20-12.5 MG tablet Commonly known as: ZESTORETIC Take 1 tablet by mouth daily.   metoprolol succinate 25 MG 24 hr tablet Commonly known as: TOPROL-XL Take 25 mg by mouth 2 (two) times daily.   oxyCODONE-acetaminophen 10-325 MG tablet Commonly known as: Percocet Take 1 tablet by mouth every 6 (six) hours as needed for pain.   Rexulti 1 MG Tabs Generic drug: Brexpiprazole Take 1 mg by mouth daily.   Vyvanse 50 MG capsule Generic drug: lisdexamfetamine Take 50 mg by mouth  every morning.      Follow-up Information    Consuella Lose, MD In 3 weeks.   Specialty: Neurosurgery Why: For wound re-check Contact information: [address] [place] [address] [phone]           Signed: Jairo Ben 11/21/2018, 10:51 AM

## 2018-11-21 NOTE — Op Note (Signed)
NEUROSURGERY OPERATIVE NOTE   PREOP DIAGNOSIS: Lumbar disc herniation, left L5-S1  POSTOP DIAGNOSIS: Minimal left-sided L5-S1 disc herniation, suspect likely hematoma on preoperative imaging.  PROCEDURE: 1. Left L5 laminotomy and microdiscectomy for decompression of nerve root 2. Use of operating microscope  SURGEON: Dr. Consuella Lose, MD  ASSISTANT: Dr.   ANESTHESIA: General Endotracheal  EBL: 400cc  SPECIMENS: None  DRAINS: None  COMPLICATIONS: None immediate  CONDITION: Hemodynamically stable to PACU  HISTORY: Suzanne Andrade is a 43 y.o. female initially seen in the outpatient neurosurgery clinic with primarily low back and left-sided leg pain.  Her work-up included MRI of the lumbar spine which demonstrated a focal superiorly migrated free disc fragment at L5-S1.  She did attempt conservative treatment with progression of her pain.  She therefore elected to proceed with surgical decompression.  The risks and benefits of the surgery were reviewed in detail with the patient.  After all her questions were answered informed consent was obtained and witnessed.  PROCEDURE IN DETAIL: After informed consent was obtained and witnessed, the patient was brought to the operating room. After induction of general anesthesia, the patient was positioned on the operative table in the prone position with all pressure points meticulously padded. The skin of the low back was then prepped and draped in the usual sterile fashion.  Under xray, the correct level was identified and marked out on the skin, and after timeout was conducted, the skin was infiltrated with local anesthetic. Skin incision was then made sharply and Bovie electrocautery was used to dissect the subcutaneous tissue until the lumbodorsal fascia was identified. The fascia was then incised using Bovie electrocautery and the lamina at the L4 and L5 levels was identified and dissection was carried out in the subperiosteal plane.  Self-retaining retractor was then placed, and intraoperative x-ray was taken to confirm we were at the correct level.  Using a high-speed drill, laminotomy was completed with a partial medial facetectomy.  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  Once the laminectomy was completed, the inferior articulating process on the left at L5 was mobile again suggesting underlying pars defect.  The ligamentum flavum was then identified and removed and the lateral edge of the thecal sac was identified.  Initially, dissector was passed into the ventral epidural space and no significant large free disc fragments were identified.  Second x-ray was taken with a dissector at the inferior and superior margins of the exposure.  This did confirm that we had exposed the L5-S1 disc space and the ventral epidural space slightly superior to that.  I did extend the laminotomy slightly more superiorly.  I was therefore able to directly visualize the exiting left L5 nerve root.  Inferiorly, I was able to identify the L5-S1 disc space and the posterior annulus of the disc space.  Again, a ball-tipped dissector was passed into the ventral epidural space and there was clearly no free disc fragment.  The ventral aspect of the thecal sac, the L5 nerve root, and the S1 nerve root were all decompressed.   Hemostasis was then secured using a combination of morcellized Gelfoam and thrombin and bipolar electrocautery. The wound is irrigated with copious amounts of antibiotic saline irrigation. The nerve root was then covered with a long-acting steroid solution. Self-retaining retractor was then removed, and the wound is closed in layers using a combination of interrupted 0 Vicryl and 3-0 Vicryl stitches. The  skin was closed using standard skin glue.  At the end of the case all sponge, needle, and instrument counts were correct. The patient was then  transferred to the stretcher and taken to the postanesthesia care unit in stable hemodynamic condition.

## 2018-11-24 ENCOUNTER — Encounter (HOSPITAL_COMMUNITY): Payer: Self-pay | Admitting: Neurosurgery

## 2018-11-24 NOTE — Anesthesia Postprocedure Evaluation (Signed)
Anesthesia Post Note  Patient: Suzanne Andrade  Procedure(s) Performed: MICRODISCECTOMY LUMBAR FIVE- SACRAL ONE LEFT (Left Back)     Patient location during evaluation: PACU Anesthesia Type: General Level of consciousness: sedated and patient cooperative Pain management: pain level controlled Vital Signs Assessment: post-procedure vital signs reviewed and stable Respiratory status: spontaneous breathing Cardiovascular status: stable Anesthetic complications: no    Last Vitals:  Vitals:   11/21/18 1145 11/21/18 1155  BP: (!) 145/71 133/66  Pulse: 80 78  Resp: 17 14  Temp: (!) 36.3 C   SpO2: 98% 99%    Last Pain:  Vitals:   11/21/18 1155  TempSrc:   PainSc: 0-No pain                 Nolon Nations

## 2020-09-13 ENCOUNTER — Other Ambulatory Visit: Payer: Self-pay | Admitting: Physician Assistant

## 2020-09-13 DIAGNOSIS — R1900 Intra-abdominal and pelvic swelling, mass and lump, unspecified site: Secondary | ICD-10-CM

## 2020-10-04 ENCOUNTER — Other Ambulatory Visit: Payer: Medicaid Other

## 2020-10-13 ENCOUNTER — Other Ambulatory Visit: Payer: Medicaid Other

## 2020-10-26 ENCOUNTER — Ambulatory Visit
Admission: RE | Admit: 2020-10-26 | Discharge: 2020-10-26 | Disposition: A | Discharge status: Home / Self Care | Payer: Medicaid Other | Source: Ambulatory Visit | Attending: Physician Assistant | Admitting: Physician Assistant

## 2020-10-26 DIAGNOSIS — R1900 Intra-abdominal and pelvic swelling, mass and lump, unspecified site: Secondary | ICD-10-CM

## 2020-11-22 ENCOUNTER — Other Ambulatory Visit: Payer: Self-pay

## 2020-11-22 ENCOUNTER — Ambulatory Visit (INDEPENDENT_AMBULATORY_CARE_PROVIDER_SITE_OTHER): Payer: Medicaid Other

## 2020-11-22 ENCOUNTER — Ambulatory Visit
Admission: RE | Admit: 2020-11-22 | Discharge: 2020-11-22 | Disposition: A | Discharge status: Home / Self Care | Payer: Medicaid Other | Source: Ambulatory Visit

## 2020-11-22 VITALS — BP 158/81 | HR 62 | Temp 98.1°F | Resp 18

## 2020-11-22 DIAGNOSIS — M79675 Pain in left toe(s): Secondary | ICD-10-CM

## 2020-11-22 DIAGNOSIS — S92515A Nondisplaced fracture of proximal phalanx of left lesser toe(s), initial encounter for closed fracture: Secondary | ICD-10-CM | POA: Diagnosis not present

## 2020-11-22 NOTE — Discharge Instructions (Addendum)
-  You have a Comminuted fracture deformity involves the fifth proximal phalanx (left pinkie toe fracture) -Follow-up with [facility] at their earliest convenience for further evaluation and management.  You can call them, or schedule this online. -Continue to buddy tape toes at home and use walking boot  -For pain- Take Tylenol 1000 mg 3 times daily, and ibuprofen 800 mg 3 times daily with food.  You can take these together, or alternate every 3-4 hours. -Elevated foot to help with swelling -Ice can help with pain and inflammation.

## 2020-11-22 NOTE — ED Triage Notes (Signed)
Three day h/o left 5th toe pain after hitting it against the frame of a door in her house. Confirms bruising, swelling and limited ROM. Standing aggravates sxs. No meds taken. No other falls or injuries. No right foot sxs.

## 2020-11-22 NOTE — ED Provider Notes (Signed)
[facility]    CSN: [id number] Arrival date & time: 11/22/20  1148      History   Chief Complaint Chief Complaint  Patient presents with   Toe Pain    Left 5th    appt 1200    HPI Suzanne Andrade is a 45 y.o. female presenting with L 5th toe pain x5 days following stubbing toe on doorframe.  Medical history noncontributory.  States the toe has hurt at the base of the fifth toe for the last 5 days, with some tingling in the distal toe.  Limited range of motion due to stiffness and pain.  Has not taken any medications for her symptoms.  Denies falling or recent other injuries.   HPI  Past Medical History:  Diagnosis Date   Anxiety    Depression    stopped meds with pregnancy   Fibroid    Headache(784.0)    Hx of varicella    Hypertension    PONV (postoperative nausea and vomiting)    S/P cesarean section 01/05/2014   Vaginal Pap smear, abnormal    ok since    Patient Active Problem List   Diagnosis Date Noted   Preeclampsia in postpartum period 08/24/2015   S/P cesarean section 01/05/2014   Abnormal TSH 06/28/2013   Essential hypertension, benign 06/28/2013    Past Surgical History:  Procedure Laterality Date   CESAREAN SECTION     CESAREAN SECTION N/A 01/05/2014   Procedure: REPEAT CESAREAN SECTION;  Surgeon: Janyth Contes, MD;  Location: [facility];  Service: Obstetrics;  Laterality: N/A;   CESAREAN SECTION N/A 08/17/2015   Procedure: CESAREAN SECTION;  Surgeon: Janyth Contes, MD;  Location: [facility];  Service: Obstetrics;  Laterality: N/A;   CYST REMOVAL HAND Right 1992   DILATION AND CURETTAGE OF UTERUS     INDUCED ABORTION     LUMBAR LAMINECTOMY/DECOMPRESSION MICRODISCECTOMY Left 11/21/2018   Procedure: MICRODISCECTOMY LUMBAR FIVE- SACRAL ONE LEFT;  Surgeon: Consuella Lose, MD;  Location: [facility];  Service: Neurosurgery;  Laterality: Left;   WISDOM TOOTH EXTRACTION Bilateral 2007    OB History     Gravida  8   Para  6   Term  6    Preterm      AB  1   Living  6      SAB      IAB  1   Ectopic      Multiple  0   Live Births  6            Home Medications    Prior to Admission medications   Medication Sig Start Date End Date Taking? Authorizing Provider  Brexpiprazole (REXULTI) 1 MG TABS Take 1 mg by mouth daily.    [provider]  cholecalciferol (VITAMIN D3) 25 MCG (1000 UT) tablet Take 1,000 Units by mouth 3 (three) times a week.    [provider]  cyclobenzaprine (FLEXERIL) 10 MG tablet Take 1 tablet (10 mg total) by mouth 2 (two) times daily as needed for muscle spasms. 11/21/18   Consuella Lose, MD  EVENING PRIMROSE OIL PO Take 1 tablet by mouth 4 (four) times a week.    [provider]  FLUoxetine (PROZAC) 20 MG capsule Take 20 mg by mouth daily.    [provider]  FLUoxetine HCl 60 MG TABS Take 60 mg by mouth daily.    [provider]  lisinopril-hydrochlorothiazide (PRINZIDE,ZESTORETIC) 20-12.5 MG tablet Take 1 tablet by mouth daily.  [provider]  metoprolol succinate (TOPROL-XL) 25 MG 24 hr tablet Take 25 mg by mouth 2 (two) times daily.    [provider]  metoprolol tartrate (LOPRESSOR) 25 MG tablet Take 25 mg by mouth 2 (two) times daily. 11/15/20   [provider]  oxyCODONE-acetaminophen (PERCOCET) 10-325 MG tablet Take 1 tablet by mouth every 6 (six) hours as needed for pain. 11/21/18   Consuella Lose, MD  VYVANSE 50 MG capsule Take 50 mg by mouth every morning. 11/23/17   [provider]    Family History Family History  Problem Relation Age of Onset   Mental illness Mother    Hypertension Mother    GER disease Mother    Hypertension Father    Diabetes Father    Mental illness Maternal Aunt    Mental illness Maternal Uncle    Diabetes Maternal Grandmother     Social History Social History   Tobacco Use   Smoking status: Never   Smokeless tobacco: Never  Vaping Use   Vaping  Use: Never used  Substance Use Topics   Alcohol use: Yes    Comment: occasional   Drug use: No     Allergies   Patient has no known allergies.   Review of Systems Review of Systems  Musculoskeletal:        L 5th toe pain  All other systems reviewed and are negative.   Physical Exam Triage Vital Signs ED Triage Vitals  Enc Vitals Group     BP 11/22/20 1237 (!) 158/81     Pulse Rate 11/22/20 1237 62     Resp 11/22/20 1237 18     Temp 11/22/20 1237 98.1 F (36.7 C)     Temp Source 11/22/20 1237 Oral     SpO2 11/22/20 1237 99 %     Weight --      Height --      Head Circumference --      Peak Flow --      Pain Score 11/22/20 1239 8     Pain Loc --      Pain Edu? --      Excl. in [place]? --    No data found.  Updated Vital Signs BP (!) 158/81 (BP Location: Left Arm)   Pulse 62   Temp 98.1 F (36.7 C) (Oral)   Resp 18   SpO2 99%   Visual Acuity Right Eye Distance:   Left Eye Distance:   Bilateral Distance:    Right Eye Near:   Left Eye Near:    Bilateral Near:     Physical Exam Vitals reviewed.  Constitutional:      General: She is not in acute distress.    Appearance: Normal appearance. She is not ill-appearing or diaphoretic.  HENT:     Head: Normocephalic and atraumatic.  Cardiovascular:     Rate and Rhythm: Normal rate and regular rhythm.     Heart sounds: Normal heart sounds.  Pulmonary:     Effort: Pulmonary effort is normal.     Breath sounds: Normal breath sounds.  Musculoskeletal:     Comments: 5th toe MTP joint with tenderness and effusion. No obvious bony deformity. ROM limited. Sensation intact. Cap refill <2 seconds, DP 2+. Ambulating with pain.  Skin:    General: Skin is warm.  Neurological:     General: No focal deficit present.     Mental Status: She is alert and oriented to person, place, and time.  Psychiatric:        Mood and Affect: Mood normal.        Behavior: Behavior normal.        Thought Content: Thought content normal.         Judgment: Judgment normal.     UC Treatments / Results  Labs (all labs ordered are listed, but only abnormal results are displayed) Labs Reviewed - No data to display  EKG   Radiology DG Foot Complete Left  Result Date: 11/22/2020 CLINICAL DATA:  Left fifth toe pain with decreased range of motion after stubbing toe against wall. EXAM: LEFT FOOT - COMPLETE 3+ VIEW COMPARISON:  None. FINDINGS: Comminuted fracture deformity is noted involving the fifth proximal phalanx. Transverse and obliquely oriented fracture components are identified. Fracture fragments are in near anatomic alignment. Overlying soft tissue swelling noted. IMPRESSION: Comminuted fracture deformity involves the fifth proximal phalanx. Electronically Signed   By: Kerby Moors M.D.   On: 11/22/2020 13:30    Procedures Procedures (including critical care time)  Medications Ordered in UC Medications - No data to display  Initial Impression / Assessment and Plan / UC Course  I have reviewed the triage vital signs and the nursing notes.  Pertinent labs & imaging results that were available during my care of the patient were reviewed by me and considered in my medical decision making (see chart for details).     This patient is a very pleasant 45 y.o. year old female presenting with L 5th toe fracture. Neurovascularly intact.  Xray L foot- Comminuted fracture deformity involves the fifth proximal phalanx.  Buddy taping, walking boot, f/u with ortho. RICE, tylenol/ibuprofen.   ED return precautions discussed. Patient verbalizes understanding and agreement.    Final Clinical Impressions(s) / UC Diagnoses   Final diagnoses:  Closed nondisplaced fracture of proximal phalanx of lesser toe of left foot, initial encounter     Discharge Instructions      -You have a Comminuted fracture deformity involves the fifth proximal phalanx (left pinkie toe fracture) -Follow-up with [facility] at their earliest  convenience for further evaluation and management.  You can call them, or schedule this online. -Continue to buddy tape toes at home and use walking boot  -For pain- Take Tylenol 1000 mg 3 times daily, and ibuprofen 800 mg 3 times daily with food.  You can take these together, or alternate every 3-4 hours. -Elevated foot to help with swelling -Ice can help with pain and inflammation.     ED Prescriptions   None    PDMP not reviewed this encounter.   Hazel Sams, PA-C 11/22/20 1411

## 2020-11-23 ENCOUNTER — Encounter (HOSPITAL_COMMUNITY): Payer: Self-pay

## 2020-11-23 ENCOUNTER — Other Ambulatory Visit: Payer: Self-pay

## 2020-11-23 ENCOUNTER — Emergency Department (HOSPITAL_COMMUNITY)
Admission: EM | Admit: 2020-11-23 | Discharge: 2020-11-24 | Disposition: A | Discharge status: Home / Self Care | Payer: Medicaid Other | Attending: Emergency Medicine | Admitting: Emergency Medicine

## 2020-11-23 DIAGNOSIS — M4317 Spondylolisthesis, lumbosacral region: Secondary | ICD-10-CM | POA: Insufficient documentation

## 2020-11-23 DIAGNOSIS — I10 Essential (primary) hypertension: Secondary | ICD-10-CM | POA: Diagnosis not present

## 2020-11-23 DIAGNOSIS — Z79899 Other long term (current) drug therapy: Secondary | ICD-10-CM | POA: Insufficient documentation

## 2020-11-23 DIAGNOSIS — Z9884 Bariatric surgery status: Secondary | ICD-10-CM | POA: Diagnosis not present

## 2020-11-23 DIAGNOSIS — R770 Abnormality of albumin: Secondary | ICD-10-CM | POA: Insufficient documentation

## 2020-11-23 DIAGNOSIS — R109 Unspecified abdominal pain: Secondary | ICD-10-CM | POA: Diagnosis present

## 2020-11-23 DIAGNOSIS — K439 Ventral hernia without obstruction or gangrene: Secondary | ICD-10-CM | POA: Diagnosis not present

## 2020-11-23 DIAGNOSIS — Z8249 Family history of ischemic heart disease and other diseases of the circulatory system: Secondary | ICD-10-CM | POA: Diagnosis not present

## 2020-11-23 DIAGNOSIS — K529 Noninfective gastroenteritis and colitis, unspecified: Secondary | ICD-10-CM | POA: Insufficient documentation

## 2020-11-23 DIAGNOSIS — D72829 Elevated white blood cell count, unspecified: Secondary | ICD-10-CM | POA: Diagnosis not present

## 2020-11-23 LAB — CBC WITH DIFFERENTIAL/PLATELET
Abs Immature Granulocytes: 0.04 10*3/uL (ref 0.00–0.07)
Basophils Absolute: 0 10*3/uL (ref 0.0–0.1)
Basophils Relative: 0 %
Eosinophils Absolute: 0.1 10*3/uL (ref 0.0–0.5)
Eosinophils Relative: 1 %
HCT: 42.4 % (ref 36.0–46.0)
Hemoglobin: 13.4 g/dL (ref 12.0–15.0)
Immature Granulocytes: 0 %
Lymphocytes Relative: 8 %
Lymphs Abs: 1 10*3/uL (ref 0.7–4.0)
MCH: 26.9 pg (ref 26.0–34.0)
MCHC: 31.6 g/dL (ref 30.0–36.0)
MCV: 85.1 fL (ref 80.0–100.0)
Monocytes Absolute: 0.5 10*3/uL (ref 0.1–1.0)
Monocytes Relative: 4 %
Neutro Abs: 10.2 10*3/uL — ABNORMAL HIGH (ref 1.7–7.7)
Neutrophils Relative %: 87 %
Platelets: 294 10*3/uL (ref 150–400)
RBC: 4.98 MIL/uL (ref 3.87–5.11)
RDW: 17.7 % — ABNORMAL HIGH (ref 11.5–15.5)
WBC: 11.8 10*3/uL — ABNORMAL HIGH (ref 4.0–10.5)
nRBC: 0 % (ref 0.0–0.2)

## 2020-11-23 LAB — I-STAT BETA HCG BLOOD, ED (MC, WL, AP ONLY): I-stat hCG, quantitative: <5 m[IU]/mL (ref <5)

## 2020-11-23 LAB — LIPASE, BLOOD: Lipase: 25 U/L (ref 11–51)

## 2020-11-23 MED ORDER — ONDANSETRON HCL 4 MG/2ML IJ SOLN
4.0000 mg | Freq: Once | INTRAMUSCULAR | Status: AC
  Administered 2020-11-23: 4 mg via INTRAVENOUS
  Filled 2020-11-23: qty 2

## 2020-11-23 MED ORDER — SODIUM CHLORIDE 0.9 % IV BOLUS
1000.0000 mL | Freq: Once | INTRAVENOUS | Status: AC
  Administered 2020-11-23: 1000 mL via INTRAVENOUS

## 2020-11-23 MED ORDER — FAMOTIDINE IN NACL 20-0.9 MG/50ML-% IV SOLN
20.0000 mg | Freq: Once | INTRAVENOUS | Status: AC
  Administered 2020-11-23: 20 mg via INTRAVENOUS
  Filled 2020-11-23: qty 50

## 2020-11-23 MED ORDER — MORPHINE SULFATE (PF) 4 MG/ML IV SOLN
4.0000 mg | Freq: Once | INTRAVENOUS | Status: AC
  Administered 2020-11-23: 4 mg via INTRAVENOUS
  Filled 2020-11-23: qty 1

## 2020-11-23 NOTE — ED Provider Notes (Signed)
[facility] DEPT Provider Note   CSN: [id number] Arrival date & time: 11/23/20  2141     History Chief Complaint  Patient presents with   Nausea    Suzanne Andrade is a 45 y.o. female with a hx of anxiety, depression, hypertension, and prior gastric sleeve who presents to the ED with complaints of N/V with abdominal pain that began today. Patient reports she took Naltrexone for the first time @ 0600 this AM and shortly afterwards started vomiting- TNTC episodes of nonbloody emesis today with associated nausea, diarrhea, and abdominal pain. Abdominal pain feels like spasms/cramping/swelling, waxing/waning in severity, worse with certain positions & palpation. Tried oral zofran @ 1400 today without much relief also tried oral bentyl with some mild relief but not resolution. Denies fever, chills, hematemesis, melena, hematochezia, dysuria, hematuria, or vaginal bleeding/discharge. Started Naltrexone today to help with her increased EtOH intake over the past 2 months, states she usually drinks 3-4 days per week. Last drink was day prior to yesterday.   HPI     Past Medical History:  Diagnosis Date   Anxiety    Depression    stopped meds with pregnancy   Fibroid    Headache(784.0)    Hx of varicella    Hypertension    PONV (postoperative nausea and vomiting)    S/P cesarean section 01/05/2014   Vaginal Pap smear, abnormal    ok since    Patient Active Problem List   Diagnosis Date Noted   Preeclampsia in postpartum period 08/24/2015   S/P cesarean section 01/05/2014   Abnormal TSH 06/28/2013   Essential hypertension, benign 06/28/2013    Past Surgical History:  Procedure Laterality Date   CESAREAN SECTION     CESAREAN SECTION N/A 01/05/2014   Procedure: REPEAT CESAREAN SECTION;  Surgeon: Janyth Contes, MD;  Location: [facility];  Service: Obstetrics;  Laterality: N/A;   CESAREAN SECTION N/A 08/17/2015   Procedure: CESAREAN SECTION;  Surgeon:  Janyth Contes, MD;  Location: [facility];  Service: Obstetrics;  Laterality: N/A;   CYST REMOVAL HAND Right 1992   DILATION AND CURETTAGE OF UTERUS     INDUCED ABORTION     LUMBAR LAMINECTOMY/DECOMPRESSION MICRODISCECTOMY Left 11/21/2018   Procedure: MICRODISCECTOMY LUMBAR FIVE- SACRAL ONE LEFT;  Surgeon: Consuella Lose, MD;  Location: [facility];  Service: Neurosurgery;  Laterality: Left;   WISDOM TOOTH EXTRACTION Bilateral 2007     OB History     Gravida  28   Para  6   Term  5   Preterm      AB  1   Living  6      SAB      IAB  1   Ectopic      Multiple  0   Live Births  61           Family History  Problem Relation Age of Onset   Mental illness Mother    Hypertension Mother    GER disease Mother    Hypertension Father    Diabetes Father    Mental illness Maternal Aunt    Mental illness Maternal Uncle    Diabetes Maternal Grandmother     Social History   Tobacco Use   Smoking status: Never   Smokeless tobacco: Never  Vaping Use   Vaping Use: Never used  Substance Use Topics   Alcohol use: Yes    Comment: occasional   Drug use: No    Home Medications Prior to Admission  medications   Medication Sig Start Date End Date Taking? Authorizing Provider  Brexpiprazole (REXULTI) 1 MG TABS Take 1 mg by mouth daily.    [provider]  cholecalciferol (VITAMIN D3) 25 MCG (1000 UT) tablet Take 1,000 Units by mouth 3 (three) times a week.    [provider]  cyclobenzaprine (FLEXERIL) 10 MG tablet Take 1 tablet (10 mg total) by mouth 2 (two) times daily as needed for muscle spasms. 11/21/18   Consuella Lose, MD  EVENING PRIMROSE OIL PO Take 1 tablet by mouth 4 (four) times a week.    [provider]  FLUoxetine (PROZAC) 20 MG capsule Take 20 mg by mouth daily.    [provider]  FLUoxetine HCl 60 MG TABS Take 60 mg by mouth daily.    [provider]  lisinopril-hydrochlorothiazide (PRINZIDE,ZESTORETIC)  20-12.5 MG tablet Take 1 tablet by mouth daily.    [provider]  metoprolol succinate (TOPROL-XL) 25 MG 24 hr tablet Take 25 mg by mouth 2 (two) times daily.    [provider]  metoprolol tartrate (LOPRESSOR) 25 MG tablet Take 25 mg by mouth 2 (two) times daily. 11/15/20   [provider]  oxyCODONE-acetaminophen (PERCOCET) 10-325 MG tablet Take 1 tablet by mouth every 6 (six) hours as needed for pain. 11/21/18   Consuella Lose, MD  VYVANSE 50 MG capsule Take 50 mg by mouth every morning. 11/23/17   [provider]    Allergies    Patient has no known allergies.  Review of Systems   Review of Systems  Constitutional:  Negative for chills and fever.  Respiratory:  Negative for shortness of breath.   Cardiovascular:  Negative for chest pain.  Gastrointestinal:  Positive for abdominal pain, diarrhea, nausea and vomiting. Negative for blood in stool and constipation.  Genitourinary:  Negative for dysuria, vaginal bleeding and vaginal discharge.  Neurological:  Negative for syncope.  All other systems reviewed and are negative.  Physical Exam Updated Vital Signs BP (!) 129/112   Pulse 60   Temp 97.9 F (36.6 C) (Oral)   Resp 18   SpO2 99%   Physical Exam Vitals and nursing note reviewed.  Constitutional:      General: She is not in acute distress.    Appearance: She is well-developed. She is not toxic-appearing.  HENT:     Head: Normocephalic and atraumatic.  Eyes:     General:        Right eye: No discharge.        Left eye: No discharge.     Conjunctiva/sclera: Conjunctivae normal.  Cardiovascular:     Rate and Rhythm: Normal rate and regular rhythm.  Pulmonary:     Effort: Pulmonary effort is normal. No respiratory distress.     Breath sounds: Normal breath sounds. No wheezing, rhonchi or rales.  Abdominal:     General: A surgical scar is present. Bowel sounds are normal. There is no distension.     Palpations: Abdomen is soft.      Tenderness: There is abdominal tenderness (generalized- more prominent in the upper abdomen). There is no guarding or rebound.     Hernia: A hernia (just supierior to umbilicus, reducible without overlying skin changes) is present.  Musculoskeletal:     Cervical back: Neck supple.     Comments: L foot with toe wrapped- recent fx per patient.   Skin:    General: Skin is warm and dry.     Findings: No rash.  Neurological:     Mental Status: She is alert.     Comments: Clear speech.   Psychiatric:        Behavior: Behavior normal.    ED Results / Procedures / Treatments   Labs (all labs ordered are listed, but only abnormal results are displayed) Labs Reviewed  CBC WITH DIFFERENTIAL/PLATELET - Abnormal; Notable for the following components:      Result Value   WBC 11.8 (*)    RDW 17.7 (*)    Neutro Abs 10.2 (*)    All other components within normal limits  COMPREHENSIVE METABOLIC PANEL - Abnormal; Notable for the following components:   Glucose, Bld 103 (*)    Calcium 8.7 (*)    Total Protein 6.0 (*)    Albumin 3.4 (*)    All other components within normal limits  LIPASE, BLOOD  URINALYSIS, ROUTINE W REFLEX MICROSCOPIC  I-STAT BETA HCG BLOOD, ED ([facility], [facility], [facility] ONLY)    EKG None  Radiology CT Abdomen Pelvis W Contrast  Result Date: 11/24/2020 CLINICAL DATA:  45 year old female with abdominal pain. EXAM: CT ABDOMEN AND PELVIS WITH CONTRAST TECHNIQUE: Multidetector CT imaging of the abdomen and pelvis was performed using the standard protocol following bolus administration of intravenous contrast. CONTRAST:  3mL OMNIPAQUE IOHEXOL 350 MG/ML SOLN COMPARISON:  CT of the chest abdomen pelvis dated 10/26/2020. FINDINGS: Lower chest: The visualized lung bases are clear. No intra-abdominal free air. Small ascites. Hepatobiliary: The liver is unremarkable. No intrahepatic biliary dilatation. Faint sludge may be present within the gallbladder. No calcified gallstone. Pancreas: Unremarkable.  No pancreatic ductal dilatation or surrounding inflammatory changes. Spleen: Normal in size without focal abnormality. Adrenals/Urinary Tract: The adrenal glands are unremarkable. There is no hydronephrosis on either side. There is symmetric enhancement and excretion of contrast by both kidneys. The visualized ureters and urinary bladder appear unremarkable. Stomach/Bowel: Postsurgical changes of gastric sleeve. There is no bowel obstruction. Multiple thickened and inflamed appearing loops of small bowel in the lower abdomen with inflammatory changes of adjacent mesentery most concerning for enteritis. Clinical correlation is recommended. There is no bowel obstruction. The appendix is poorly visualized but grossly unremarkable. Vascular/Lymphatic: The abdominal aorta and IVC are unremarkable. No portal venous gas. There is no adenopathy. Reproductive: The uterus is anteverted and grossly unremarkable. No adnexal masses. Other: There is a fat containing midline supraumbilical hernia, increased in size since the prior CT. The neck of the hernia defect measures approximately 3 cm in diameter. There is stranding of the herniated fat which may be related to mesenteric edema and enteritis. Clinical correlation is recommended to exclude strangulation/incarceration. Musculoskeletal: Bilateral L5 pars defects with grade 1 L5-S1 anterolisthesis. There is disc desiccation and vacuum phenomena and degenerative changes at L5-S1. No acute osseous pathology. Chronic bilateral sacroiliitis. IMPRESSION: 1. Findings most concerning for enteritis. Clinical correlation is recommended. No bowel obstruction. 2. Fat containing midline supraumbilical hernia, increased in size since the prior CT. There is stranding of the herniated fat which may be related to mesenteric edema and enteritis. Clinical correlation is recommended to exclude strangulation/incarceration. 3. Bilateral L5 pars defects with grade 1 L5-S1 anterolisthesis.  Electronically Signed   By: Anner Crete M.D.   On: 11/24/2020 01:33   DG Foot Complete Left  Result Date: 11/22/2020 CLINICAL DATA:  Left fifth toe pain with decreased range of motion after stubbing toe against wall. EXAM: LEFT FOOT - COMPLETE 3+ VIEW COMPARISON:  None. FINDINGS: Comminuted fracture deformity is noted involving the fifth proximal  phalanx. Transverse and obliquely oriented fracture components are identified. Fracture fragments are in near anatomic alignment. Overlying soft tissue swelling noted. IMPRESSION: Comminuted fracture deformity involves the fifth proximal phalanx. Electronically Signed   By: Kerby Moors M.D.   On: 11/22/2020 13:30    Procedures Procedures   Medications Ordered in ED Medications - No data to display  ED Course  I have reviewed the triage vital signs and the nursing notes.  Pertinent labs & imaging results that were available during my care of the patient were reviewed by me and considered in my medical decision making (see chart for details).    MDM Rules/Calculators/A&P                          Patient presents to the ED with complaints of N/V/D with abdominal pain.  Nontoxic, vitals with elevated blood pressure and intermittent bradycardia, normal heart rate on my exam.  Abdomen with generalized tenderness more prominent in the upper abdomen.  No peritoneal signs.  Hernia present, this is reducible without overlying skin changes.  DDx: Viral GI illness, obstruction, perforation, appendicitis, diverticulitis, pancreatitis, cholecystitis, cholelithiasis.  Additional history obtained:  Additional history obtained from chart review & nursing note review.   Lab Tests:  I Ordered, reviewed, and interpreted labs, which included:  See: Mild leukocytosis of 11.8, no critical anemia. CMP: Mild hypoalbuminemia, LFTs within normal limits Lipase: Within normal limits Pregnancy test: Negative  Imaging Studies ordered:  I ordered imaging  studies which included CT abdomen/pelvis with contrast, I independently reviewed, formal radiology impression shows:  1. Findings most concerning for enteritis. Clinical correlation is recommended. No bowel obstruction. 2. Fat containing midline supraumbilical hernia, increased in size since the prior CT. There is stranding of the herniated fat which may be related to mesenteric edema and enteritis. Clinical correlation is recommended to exclude strangulation/incarceration. 3. Bilateral L5 pars defects with grade 1 L5-S1 anterolisthesis  ED Course:  In regards to CT findings of patient supraumbilical hernia, there are no overlying skin changes, this is easily reducible, clinically not consistent with strangulation/incarceration.  CT scan without findings of obstruction or other acute surgical process at this time.  CT does have findings of enteritis which is consistent with patient's clinical presentation.  Following Zofran and morphine patient had some mild residual discomfort for which she received fentanyl, also required Phenergan for additional nausea control.  She is tolerating p.o. now without difficulty and is requesting to go home.  Repeat abdominal exam remains without peritoneal signs and remains with reducible hernia.  Will discharge home with supportive care with PCP follow-up as well as general surgery follow-up for her hernia. I discussed results, treatment plan, need for follow-up, and return precautions with the patient. Provided opportunity for questions, patient confirmed understanding and is in agreement with plan.   Portions of this note were generated with [employer]. Dictation errors may occur despite best attempts at proofreading.  Final Clinical Impression(s) / ED Diagnoses Final diagnoses:  Enteritis  Supraumbilical hernia    Rx / DC Orders ED Discharge Orders          Ordered    dicyclomine (BENTYL) 20 MG tablet  Every 8 hours PRN        11/24/20 0323     promethazine (PHENERGAN) 25 MG tablet  Every 8 hours PRN        11/24/20 0323  Amaryllis Dyke, PA-C 11/24/20 0328    Maudie Flakes, MD 11/24/20 [phone]

## 2020-11-23 NOTE — ED Triage Notes (Signed)
Pt took one Naltrexone  50 mg tablet around 1800 today. Pt states that soon after she started feeling nauseous. Pt states she cannot keep anything down. Pt states she has a hernia and feels like it is protruding more.

## 2020-11-24 ENCOUNTER — Emergency Department (HOSPITAL_COMMUNITY): Payer: Medicaid Other

## 2020-11-24 ENCOUNTER — Encounter (HOSPITAL_COMMUNITY): Payer: Self-pay

## 2020-11-24 LAB — COMPREHENSIVE METABOLIC PANEL
ALT: 25 U/L (ref 0–44)
AST: 33 U/L (ref 15–41)
Albumin: 3.4 g/dL — ABNORMAL LOW (ref 3.5–5.0)
Alkaline Phosphatase: 57 U/L (ref 38–126)
Anion gap: 7 (ref 5–15)
BUN: 14 mg/dL (ref 6–20)
CO2: 29 mmol/L (ref 22–32)
Calcium: 8.7 mg/dL — ABNORMAL LOW (ref 8.9–10.3)
Chloride: 102 mmol/L (ref 98–111)
Creatinine, Ser: 0.72 mg/dL (ref 0.44–1.00)
GFR, Estimated: >60 mL/min (ref >60)
Glucose, Bld: 103 mg/dL — ABNORMAL HIGH (ref 70–99)
Potassium: 4 mmol/L (ref 3.5–5.1)
Sodium: 138 mmol/L (ref 135–145)
Total Bilirubin: 1.1 mg/dL (ref 0.3–1.2)
Total Protein: 6 g/dL — ABNORMAL LOW (ref 6.5–8.1)

## 2020-11-24 MED ORDER — DICYCLOMINE HCL 10 MG/ML IM SOLN
20.0000 mg | Freq: Once | INTRAMUSCULAR | Status: AC
  Administered 2020-11-24: 20 mg via INTRAMUSCULAR
  Filled 2020-11-24: qty 2

## 2020-11-24 MED ORDER — SODIUM CHLORIDE (PF) 0.9 % IJ SOLN
INTRAMUSCULAR | Status: AC
  Filled 2020-11-24: qty 50

## 2020-11-24 MED ORDER — SODIUM CHLORIDE 0.9 % IV SOLN
12.5000 mg | Freq: Four times a day (QID) | INTRAVENOUS | Status: DC | PRN
  Administered 2020-11-24: 12.5 mg via INTRAVENOUS
  Filled 2020-11-24: qty 12.5

## 2020-11-24 MED ORDER — FENTANYL CITRATE (PF) 100 MCG/2ML IJ SOLN
50.0000 ug | Freq: Once | INTRAMUSCULAR | Status: AC
  Administered 2020-11-24: 50 ug via INTRAVENOUS
  Filled 2020-11-24: qty 2

## 2020-11-24 MED ORDER — DICYCLOMINE HCL 20 MG PO TABS: 20.0000 mg | ORAL_TABLET | Freq: Three times a day (TID) | ORAL | 0 refills | Status: DC | PRN

## 2020-11-24 MED ORDER — IOHEXOL 350 MG/ML SOLN
100.0000 mL | Freq: Once | INTRAVENOUS | Status: AC | PRN
  Administered 2020-11-24: 80 mL via INTRAVENOUS

## 2020-11-24 MED ORDER — PROMETHAZINE HCL 25 MG PO TABS: 25.0000 mg | ORAL_TABLET | Freq: Three times a day (TID) | ORAL | 0 refills | Status: DC | PRN

## 2020-11-24 NOTE — ED Notes (Signed)
Pt verbalized understanding of d/c, medication, and follow up care. Ambulatory with steady gait.  

## 2020-11-24 NOTE — Discharge Instructions (Addendum)
You were seen in the ER today for abdominal pain with nausea and vomiting.  Your CT scan showed findings of enteritis, we suspect you have a viral GI illness.  Please see attached handout.  Please see attached diet guidelines.  We are sending you home with Phenergan to take every 8 hours as needed for nausea and vomiting as well as Bentyl to take every 8 hours as needed for abdominal spasm/cramping.  We have prescribed you new medication(s) today. Discuss the medications prescribed today with your pharmacist as they can have adverse effects and interactions with your other medicines including over the counter and prescribed medications. Seek medical evaluation if you start to experience new or abnormal symptoms after taking one of these medicines, seek care immediately if you start to experience difficulty breathing, feeling of your throat closing, facial swelling, or rash as these could be indications of a more serious allergic reaction  There was also a hernia noted on your CT scan-we are providing general surgery follow-up for this, there was also some degenerative changes in your lower back= please discuss it with your primary care provider.  Please follow-up with primary care within 3 days.  Return to the ER for new or worsening symptoms including but not limited to inability to keep fluids down, fever, blood in vomit or stool, new or worsening pain, passing out, or any other concerns.

## 2020-11-24 NOTE — ED Notes (Signed)
Pt given water for PO challenge 

## 2020-12-19 ENCOUNTER — Ambulatory Visit (INDEPENDENT_AMBULATORY_CARE_PROVIDER_SITE_OTHER): Payer: Medicaid Other | Admitting: Podiatry

## 2020-12-19 ENCOUNTER — Other Ambulatory Visit: Payer: Self-pay

## 2020-12-19 ENCOUNTER — Ambulatory Visit (INDEPENDENT_AMBULATORY_CARE_PROVIDER_SITE_OTHER): Payer: Medicaid Other

## 2020-12-19 DIAGNOSIS — S92505A Nondisplaced unspecified fracture of left lesser toe(s), initial encounter for closed fracture: Secondary | ICD-10-CM

## 2020-12-19 DIAGNOSIS — M79672 Pain in left foot: Secondary | ICD-10-CM

## 2020-12-26 NOTE — Progress Notes (Signed)
   HPI: 45 y.o. female presenting today as a new patient for follow-up evaluation of a fracture that was diagnosed about 1 month ago at urgent care.  Patient states that she injured her left fifth toe about a month ago when she went to an urgent care and was diagnosed with a fracture.  She was given a tall cam boot that is very cumbersome and she states that it is very aggravating to wear.  There has been no significant improvement.  She presents for further treatment and evaluation  Past Medical History:  Diagnosis Date   Anxiety    Depression    stopped meds with pregnancy   Fibroid    Headache(784.0)    Hx of varicella    Hypertension    PONV (postoperative nausea and vomiting)    S/P cesarean section 01/05/2014   Vaginal Pap smear, abnormal    ok since     Physical Exam: General: The patient is alert and oriented x3 in no acute distress.  Dermatology: Skin is warm, dry and supple bilateral lower extremities. Negative for open lesions or macerations.  Vascular: Palpable pedal pulses bilaterally. No edema or erythema noted. Capillary refill within normal limits.  Neurological: Epicritic and protective threshold grossly intact bilaterally.   Musculoskeletal Exam: Range of motion within normal limits to all pedal and ankle joints bilateral. Muscle strength 5/5 in all groups bilateral.   Radiographic Exam:  Normal osseous mineralization.  Closed, nondisplaced fracture of the proximal phalanx left fifth toe noted.  Overall decent alignment.  Assessment: 1.  Fracture proximal phalanx left fifth toe closed, nondisplaced, initial encounter   Plan of Care:  1. Patient evaluated. X-Rays reviewed.  2.  Shorter cam boot was dispensed today 3.  Explained to the patient that she will have some achiness as the toe heals.  I reassured the patient that having symptoms and some pain with ambulation even 1 month after the injury is normal.  This may take up to 2-3 months to completely  resolve 4.  Continue minimal weightbearing as tolerated 5.  Return to clinic in 2 months for follow-up x-ray      Edrick Kins, DPM [facility]  Dr. Edrick Kins, DPM    [address], [place] [address]                Office [phone]  Fax [phone]

## 2021-01-09 NOTE — Progress Notes (Unsigned)
l °

## 2021-01-18 ENCOUNTER — Ambulatory Visit: Payer: Medicaid Other | Admitting: Podiatry

## 2021-04-10 ENCOUNTER — Ambulatory Visit
Admission: RE | Admit: 2021-04-10 | Discharge: 2021-04-10 | Disposition: A | Discharge status: Home / Self Care | Payer: Medicaid Other | Source: Ambulatory Visit | Attending: Family Medicine | Admitting: Family Medicine

## 2021-04-10 ENCOUNTER — Other Ambulatory Visit: Payer: Self-pay | Admitting: Family Medicine

## 2021-04-10 DIAGNOSIS — R52 Pain, unspecified: Secondary | ICD-10-CM

## 2021-07-09 ENCOUNTER — Encounter (HOSPITAL_COMMUNITY): Payer: Self-pay | Admitting: *Deleted

## 2021-07-09 ENCOUNTER — Other Ambulatory Visit: Payer: Self-pay

## 2021-07-09 ENCOUNTER — Emergency Department (HOSPITAL_COMMUNITY)
Admission: EM | Admit: 2021-07-09 | Discharge: 2021-07-09 | Disposition: A | Discharge status: Home / Self Care | Payer: Medicaid Other | Attending: Emergency Medicine | Admitting: Emergency Medicine

## 2021-07-09 DIAGNOSIS — R220 Localized swelling, mass and lump, head: Secondary | ICD-10-CM | POA: Diagnosis present

## 2021-07-09 DIAGNOSIS — T783XXA Angioneurotic edema, initial encounter: Secondary | ICD-10-CM | POA: Diagnosis not present

## 2021-07-09 MED ORDER — FAMOTIDINE 20 MG PO TABS
40.0000 mg | ORAL_TABLET | Freq: Once | ORAL | Status: AC
  Administered 2021-07-09: 40 mg via ORAL
  Filled 2021-07-09: qty 2

## 2021-07-09 MED ORDER — PREDNISONE 20 MG PO TABS
60.0000 mg | ORAL_TABLET | Freq: Once | ORAL | Status: AC
  Administered 2021-07-09: 60 mg via ORAL
  Filled 2021-07-09: qty 3

## 2021-07-09 MED ORDER — DIPHENHYDRAMINE HCL 25 MG PO CAPS
50.0000 mg | ORAL_CAPSULE | Freq: Once | ORAL | Status: AC
  Administered 2021-07-09: 50 mg via ORAL
  Filled 2021-07-09: qty 2

## 2021-07-09 MED ORDER — PREDNISONE 20 MG PO TABS: ORAL_TABLET | ORAL | 0 refills | Status: DC

## 2021-07-09 NOTE — ED Triage Notes (Signed)
Lower lip swelling, pt is on lisinopril.

## 2021-07-09 NOTE — Discharge Instructions (Signed)
Most likely this reaction is due to the medication that you take for your blood pressure.  Please stop taking this and call your family doctor on Monday and asked them if they would like you to start a different medication.  Please return for worsening swelling difficulty breathing feeling like you may pass out.  I prescribed you a course of steroids.  You can take antihistamines such as Benadryl as well.

## 2021-07-09 NOTE — ED Provider Notes (Signed)
[facility] DEPT Provider Note   CSN: [id number] Arrival date & time: 07/09/21  0021     History  Chief Complaint  Patient presents with   Oral Swelling    Suzanne Andrade is a 46 y.o. female.  46 yo F with a cc of lower lip swelling.  It sounds like this been going on for a couple days actually.  She was encouraged by family to come in this evening.  No trouble swallowing or breathing.  She has multiple family members at home that have been sick.  She denies nausea or vomiting denies feeling like she may pass out denies wheezing.  She is on lisinopril and recently had a medication refill and she noticed that the manufacturer had changed.  She also described an episode where the family's hamster had crawled across the lower aspect of her face and she was wondering if she had an allergy to it.       Home Medications Prior to Admission medications   Medication Sig Start Date End Date Taking? Authorizing Provider  predniSONE (DELTASONE) 20 MG tablet 2 tabs po daily x 4 days 07/09/21  Yes Tyrone Nine, Rielle Schlauch, DO  Brexpiprazole (REXULTI) 1 MG TABS Take 1 mg by mouth daily.    [provider]  cholecalciferol (VITAMIN D3) 25 MCG (1000 UT) tablet Take 1,000 Units by mouth 3 (three) times a week.    [provider]  cyclobenzaprine (FLEXERIL) 10 MG tablet Take 1 tablet (10 mg total) by mouth 2 (two) times daily as needed for muscle spasms. 11/21/18   Consuella Lose, MD  dicyclomine (BENTYL) 20 MG tablet Take 1 tablet (20 mg total) by mouth every 8 (eight) hours as needed for spasms. 11/24/20   Petrucelli, Samantha R, PA-C  EVENING PRIMROSE OIL PO Take 1 tablet by mouth 4 (four) times a week.    [provider]  FLUoxetine (PROZAC) 20 MG capsule Take 20 mg by mouth daily.    [provider]  FLUoxetine (PROZAC) 40 MG capsule Take 40 mg by mouth daily. 03/21/21   [provider]  FLUoxetine HCl 60 MG TABS Take 60 mg by mouth  daily.    [provider]  lamoTRIgine 100 MG TBDP Take 1 tablet by mouth daily. 06/28/21   [provider]  lamoTRIgine 200 MG TBDP Take 1 tablet by mouth daily. 06/28/21   [provider]  metoprolol succinate (TOPROL-XL) 25 MG 24 hr tablet Take 25 mg by mouth 2 (two) times daily.    [provider]  metoprolol tartrate (LOPRESSOR) 25 MG tablet Take 25 mg by mouth 2 (two) times daily. 11/15/20   [provider]  ondansetron (ZOFRAN-ODT) 4 MG disintegrating tablet Take 4 mg by mouth every 8 (eight) hours as needed. 01/12/21   [provider]  oxyCODONE-acetaminophen (PERCOCET) 10-325 MG tablet Take 1 tablet by mouth every 6 (six) hours as needed for pain. 11/21/18   Consuella Lose, MD  pantoprazole (PROTONIX) 40 MG tablet Take 40 mg by mouth daily. 04/27/21   [provider]  promethazine (PHENERGAN) 25 MG tablet Take 1 tablet (25 mg total) by mouth every 8 (eight) hours as needed for nausea or vomiting. 11/24/20   Petrucelli, Samantha R, PA-C  VYVANSE 50 MG capsule Take 50 mg by mouth every morning. 11/23/17   [provider]      Allergies    Patient has no known allergies.    Review of Systems   Review of  Systems  Physical Exam Updated Vital Signs BP 94/76    Pulse 69    Temp 98.4 F (36.9 C)    Resp 11    Ht 5\' 4"  (1.626 m)    SpO2 99%    BMI 49.07 kg/m  Physical Exam Vitals and nursing note reviewed.  Constitutional:      General: She is not in acute distress.    Appearance: She is well-developed. She is not diaphoretic.  HENT:     Head: Normocephalic and atraumatic.     Comments: Isolated lower lip edema.  She has some posterior oropharyngeal erythema with posterior nasal drip swollen turbinates. Eyes:     Pupils: Pupils are equal, round, and reactive to light.  Cardiovascular:     Rate and Rhythm: Normal rate and regular rhythm.     Heart sounds: No murmur heard.   No friction rub. No gallop.  Pulmonary:      Effort: Pulmonary effort is normal.     Breath sounds: No wheezing or rales.  Abdominal:     General: There is no distension.     Palpations: Abdomen is soft.     Tenderness: There is no abdominal tenderness.  Musculoskeletal:        General: No tenderness.     Cervical back: Normal range of motion and neck supple.  Skin:    General: Skin is warm and dry.  Neurological:     Mental Status: She is alert and oriented to person, place, and time.  Psychiatric:        Behavior: Behavior normal.    ED Results / Procedures / Treatments   Labs (all labs ordered are listed, but only abnormal results are displayed) Labs Reviewed - No data to display  EKG None  Radiology No results found.  Procedures Procedures    Medications Ordered in ED Medications  predniSONE (DELTASONE) tablet 60 mg (60 mg Oral Given 07/09/21 0046)  diphenhydrAMINE (BENADRYL) capsule 50 mg (50 mg Oral Given 07/09/21 0046)  famotidine (PEPCID) tablet 40 mg (40 mg Oral Given 07/09/21 0046)    ED Course/ Medical Decision Making/ A&P                           Medical Decision Making Risk Prescription drug management.   46 yo F with a chief complaint of lower lip swelling.  Most likely this is angioedema based on history and physical.  She also may have a con commitment URI and had been exposed to one recently at home.  She is having no difficulty breathing or swallowing.  No other signs or symptoms of anaphylaxis.  Will give steroids and antihistamines and reassess.  Patient reassessed and has had some improvement of her swelling.  Shared decision making at bedside about further observation in the ER versus discharge patient electing for discharge at this time.  We will have her stop her lisinopril.  Have her follow-up with her family doctor.  1:34 AM:  I have discussed the diagnosis/risks/treatment options with the patient and family.  Evaluation and diagnostic testing in the emergency department does not  suggest an emergent condition requiring admission or immediate intervention beyond what has been performed at this time.  They will follow up with  PCP. We also discussed returning to the ED immediately if new or worsening sx occur. We discussed the sx which are most concerning (e.g., sudden worsening pain, fever, inability to tolerate by mouth) that necessitate  immediate return. Medications administered to the patient during their visit and any new prescriptions provided to the patient are listed below.  Medications given during this visit Medications  predniSONE (DELTASONE) tablet 60 mg (60 mg Oral Given 07/09/21 0046)  diphenhydrAMINE (BENADRYL) capsule 50 mg (50 mg Oral Given 07/09/21 0046)  famotidine (PEPCID) tablet 40 mg (40 mg Oral Given 07/09/21 0046)     The patient appears reasonably screen and/or stabilized for discharge and I doubt any other medical condition or other [facility] requiring further screening, evaluation, or treatment in the ED at this time prior to discharge.          Final Clinical Impression(s) / ED Diagnoses Final diagnoses:  Angioedema, initial encounter    Rx / DC Orders ED Discharge Orders          Ordered    predniSONE (DELTASONE) 20 MG tablet        07/09/21 0131              Deno Etienne, DO 07/09/21 0134

## 2021-08-09 ENCOUNTER — Encounter: Payer: Medicaid Other | Admitting: Vascular Surgery

## 2021-08-16 ENCOUNTER — Encounter: Payer: Self-pay | Admitting: Vascular Surgery

## 2021-08-16 ENCOUNTER — Ambulatory Visit (INDEPENDENT_AMBULATORY_CARE_PROVIDER_SITE_OTHER): Payer: Medicaid Other | Admitting: Vascular Surgery

## 2021-08-16 VITALS — BP 160/89 | HR 70 | Temp 98.3°F | Resp 14 | Ht 64.0 in | Wt 214.0 lb

## 2021-08-16 DIAGNOSIS — I83813 Varicose veins of bilateral lower extremities with pain: Secondary | ICD-10-CM

## 2021-08-16 DIAGNOSIS — I8393 Asymptomatic varicose veins of bilateral lower extremities: Secondary | ICD-10-CM

## 2021-08-16 NOTE — Progress Notes (Signed)
? ?ASSESSMENT & PLAN  ? ?CHRONIC VENOUS INSUFFICIENCY: This patient has CEAP C2 venous disease (varicose veins).  We have discussed the importance of intermittent leg elevation and the proper positioning for this.  In addition we had her fitted for a knee-high compression stocking with a gradient of 15 to 20 mmHg.  I encouraged her to avoid prolonged sitting and standing.  We discussed the importance of exercise specifically walking and water aerobics.  We also discussed the importance of maintaining a healthy weight as central obesity especially increases lower extremity venous pressure.  If her symptoms progress then I think she would need formal venous reflux testing on the right. ? ?REASON FOR CONSULT:   ? ?Varicose veins of both lower extremities.  The consult is requested by Dr. Lin Landsman. ? ?HPI:  ? ?Suzanne Andrade is a 45 y.o. female who was referred with painful varicose veins of both lower extremities.  I reviewed the records from the referring office.  The patient was seen on 07/11/2021.  She is followed with essential hypertension obesity and gastroesophageal reflux disease.  She was noted to have some bumpy areas in both eyes and also varicose veins in the right leg.  She is sent for venous evaluation. ? ?On my history the patient has some varicose veins on the lateral aspect of the right thigh and right leg.  These are not especially symptomatic although she does describe some aching pain and heaviness in her legs which is aggravated by prolonged sitting and standing and relieved with elevation.  She does not wear compression stockings. ? ?She has undergone a gastric sleeve procedure for weight loss and has done well since that standpoint.  She is also had previous back surgery.  She complains of some occasional paresthesias in her feet but has no history of diabetes.  She denies any history of claudication, rest pain, or nonhealing ulcers.  She had no previous history of DVT.  She has had no  previous venous procedures. ? ?Past Medical History:  ?Diagnosis Date  ? Anxiety   ? Depression   ? stopped meds with pregnancy  ? Fibroid   ? Headache(784.0)   ? Hx of varicella   ? Hypertension   ? PONV (postoperative nausea and vomiting)   ? S/P cesarean section 01/05/2014  ? Vaginal Pap smear, abnormal   ? ok since  ? ? ?Family History  ?Problem Relation Age of Onset  ? Mental illness Mother   ? Hypertension Mother   ? GER disease Mother   ? Hypertension Father   ? Diabetes Father   ? Mental illness Maternal Aunt   ? Mental illness Maternal Uncle   ? Diabetes Maternal Grandmother   ? ? ?SOCIAL HISTORY: ?Social History  ? ?Tobacco Use  ? Smoking status: Never  ? Smokeless tobacco: Never  ?Substance Use Topics  ? Alcohol use: Yes  ?  Comment: occasional  ? ? ?No Known Allergies ? ?Current Outpatient Medications  ?Medication Sig Dispense Refill  ? Brexpiprazole (REXULTI) 1 MG TABS Take 1 mg by mouth daily.    ? cholecalciferol (VITAMIN D3) 25 MCG (1000 UT) tablet Take 1,000 Units by mouth 3 (three) times a week.    ? cyclobenzaprine (FLEXERIL) 10 MG tablet Take 1 tablet (10 mg total) by mouth 2 (two) times daily as needed for muscle spasms. 30 tablet 0  ? dicyclomine (BENTYL) 20 MG tablet Take 1 tablet (20 mg total) by mouth every 8 (eight) hours as needed for  spasms. 15 tablet 0  ? EVENING PRIMROSE OIL PO Take 1 tablet by mouth 4 (four) times a week.    ? FLUoxetine (PROZAC) 20 MG capsule Take 20 mg by mouth daily.    ? FLUoxetine (PROZAC) 40 MG capsule Take 40 mg by mouth daily.    ? FLUoxetine HCl 60 MG TABS Take 60 mg by mouth daily.    ? lamoTRIgine 100 MG TBDP Take 1 tablet by mouth daily.    ? lamoTRIgine 200 MG TBDP Take 1 tablet by mouth daily.    ? metoprolol succinate (TOPROL-XL) 25 MG 24 hr tablet Take 25 mg by mouth 2 (two) times daily.    ? metoprolol tartrate (LOPRESSOR) 25 MG tablet Take 25 mg by mouth 2 (two) times daily.    ? ondansetron (ZOFRAN-ODT) 4 MG disintegrating tablet Take 4 mg by mouth  every 8 (eight) hours as needed.    ? oxyCODONE-acetaminophen (PERCOCET) 10-325 MG tablet Take 1 tablet by mouth every 6 (six) hours as needed for pain. 30 tablet 0  ? pantoprazole (PROTONIX) 40 MG tablet Take 40 mg by mouth daily.    ? predniSONE (DELTASONE) 20 MG tablet 2 tabs po daily x 4 days 8 tablet 0  ? promethazine (PHENERGAN) 25 MG tablet Take 1 tablet (25 mg total) by mouth every 8 (eight) hours as needed for nausea or vomiting. 15 tablet 0  ? VYVANSE 50 MG capsule Take 50 mg by mouth every morning.  0  ? ?No current facility-administered medications for this visit.  ? ? ?REVIEW OF SYSTEMS:  ?'[X]'$  denotes positive finding, '[ ]'$  denotes negative finding ?Cardiac  Comments:  ?Chest pain or chest pressure:    ?Shortness of breath upon exertion:    ?Short of breath when lying flat:    ?Irregular heart rhythm:    ?    ?Vascular    ?Pain in calf, thigh, or hip brought on by ambulation:    ?Pain in feet at night that wakes you up from your sleep:  x   ?Blood clot in your veins:    ?Leg swelling:     ?    ?Pulmonary    ?Oxygen at home:    ?Productive cough:     ?Wheezing:     ?    ?Neurologic    ?Sudden weakness in arms or legs:     ?Sudden numbness in arms or legs:     ?Sudden onset of difficulty speaking or slurred speech:    ?Temporary loss of vision in one eye:     ?Problems with dizziness:     ?    ?Gastrointestinal    ?Blood in stool:     ?Vomited blood:     ?    ?Genitourinary    ?Burning when urinating:     ?Blood in urine:    ?    ?Psychiatric    ?Major depression:  x   ?    ?Hematologic    ?Bleeding problems:    ?Problems with blood clotting too easily:    ?    ?Skin    ?Rashes or ulcers:    ?    ?Constitutional    ?Fever or chills:    ?- ? ?PHYSICAL EXAM:  ? ?Vitals:  ? 08/16/21 1150  ?Resp: 14  ?SpO2: 100%  ?Weight: 214 lb (97.1 kg)  ?Height: '5\' 4"'$  (1.626 m)  ? ?Body mass index is 36.73 kg/m?. ? ?GENERAL: The patient is a well-nourished female, in  no acute distress. The vital signs are documented  above. ?CARDIAC: There is a regular rate and rhythm.  ?VASCULAR: I do not detect carotid bruits. ?She has palpable posterior tibial pulses bilaterally. ?She has mild bilateral lower extremity swelling. ? ? ?PULMONARY: There is good air exchange bilaterally without wheezing or rales. ?ABDOMEN: Soft and non-tender with normal pitched bowel sounds.  ?MUSCULOSKELETAL: There are no major deformities. ?NEUROLOGIC: No focal weakness or paresthesias are detected. ?SKIN: There are no ulcers or rashes noted. ?PSYCHIATRIC: The patient has a normal affect. ? ?DATA:   ? ?No new data ? ?Deitra Mayo ?[facility]  ?

## 2022-05-14 NOTE — Progress Notes (Unsigned)
New Patient Note  RE: Suzanne Andrade MRN: [id number] DOB: 1976/02/28 Date of Office Visit: 05/15/2022  Consult requested by: Lin Landsman, MD Primary care provider: Lin Landsman, MD  Chief Complaint: Rash (Rash that started in July that has gotten better since then. Flared in the middle of November and has gotten better since then. ) and Other (Has a knot on her left leg that has been there for about a year. )  History of Present Illness: I had the pleasure of seeing Suzanne Andrade for initial evaluation at the [facility] on 05/15/2022. She is a 47 y.o. female, who is referred here by Lin Landsman, MD for the evaluation of rash.  Rash: Rash started in August 2023. This started on her legs and then spread to her arms and torso. Describes them as slightly itchy. It started with little bumps and then they joined together. She was initially concerned about ringworms but now they have faded. Nobody else at home has this rash.   The rash improved in September and then it flared in November.  No ecchymosis but left some scarring. Associated symptoms include: none.  Frequency of episodes: symptoms did not resolve since August but they are better now.  Suspected triggers are unknown. Denies any fevers, chills, changes in medications, foods, personal care products or recent infections.   She has tried the following therapies: triamcinolone with minimal benefit. Systemic steroids no.  Previous work up includes: no. Previous history of rash/hives: no. Patient is up to date with the following cancer screening tests: not up to date with physical exam or mammogram.  Patient also has a knot on her left shin area - it's getting bigger. She had this for about 2 years now. Had X-ray in 2022.  Angioedema:  Patient went to the ER for lip swelling in February which was due to lisinopril.  Previous history of swelling: no. Family history of angioedema: no. Ace-inhibitor use:  not  anymore  07/09/2021 ER visit: "47 yo F with a cc of lower lip swelling. It sounds like this been going on for a couple days actually. She was encouraged by family to come in this evening. No trouble swallowing or breathing. She has multiple family members at home that have been sick. She denies nausea or vomiting denies feeling like she may pass out denies wheezing. She is on lisinopril and recently had a medication refill and she noticed that the manufacturer had changed. She also described an episode where the family's hamster had crawled across the lower aspect of her face and she was wondering if she had an allergy to it."  Assessment and Plan: Suzanne Andrade is a 47 y.o. female with: Rash and other nonspecific skin eruption Rash x 4 months with no triggers. Improved since onset and not pruritic. Denies changes in diet, meds, personal care products. Nobody else has this rash at home. Triamcinolone somewhat helpful.  Unclear etiology - less likely to be allergic as it is not very pruritic.  Some areas look like eczema.  If this worsens - take pictures and will need referral to dermatology next. Symptoms improving now.  No indication for any allergy testing today. See below for proper skin care measures. Use triamcinolone 0.1% ointment twice a day as needed for rash flares. Do not use on the face, neck, armpits or groin area. Do not use more than 3 weeks in a row.   Angio-edema Lisinopril induced. No prior history of angioedema or family history  of angioedema. Continue to avoid all ace inhibitor type of medications. If you have another episodes then will need to get bloodwork next.  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  Follow up with PCP if it gets bigger.   Return if symptoms worsen or fail to improve.  No orders of the defined types were placed in this encounter.  Lab Orders  No laboratory test(s) ordered  today    Other allergy screening: Asthma: no Rhino conjunctivitis: yes Mild rhino conjunctivitis in the spring and takes OTC antihistamines prn with good benefit.   Food allergy: no Medication allergy: yes Hymenoptera allergy: no History of recurrent infections suggestive of immunodeficency: no  Diagnostics: None.   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 05/15/2022   Preeclampsia in postpartum period 08/24/2015   S/P cesarean section 01/05/2014   Abnormal TSH 06/28/2013   Essential hypertension, benign 06/28/2013   Past Medical History:  Diagnosis Date   Anxiety    Depression    stopped meds with pregnancy   Fibroid    Headache(784.0)    Hx of varicella    Hypertension    PONV (postoperative nausea and vomiting)    S/P cesarean section 01/05/2014   Vaginal Pap smear, abnormal    ok since   Past Surgical History: Past Surgical History:  Procedure Laterality Date   CESAREAN SECTION     CESAREAN SECTION N/A 01/05/2014   Procedure: REPEAT CESAREAN SECTION;  Surgeon: Janyth Contes, MD;  Location: [facility];  Service: Obstetrics;  Laterality: N/A;   CESAREAN SECTION N/A 08/17/2015   Procedure: CESAREAN SECTION;  Surgeon: Janyth Contes, MD;  Location: [facility];  Service: Obstetrics;  Laterality: N/A;   CYST REMOVAL HAND Right 1992   DILATION AND CURETTAGE OF UTERUS     INDUCED ABORTION     LUMBAR LAMINECTOMY/DECOMPRESSION MICRODISCECTOMY Left 11/21/2018   Procedure: MICRODISCECTOMY LUMBAR FIVE- SACRAL ONE LEFT;  Surgeon: Consuella Lose, MD;  Location: [facility];  Service: Neurosurgery;  Laterality: Left;   WISDOM TOOTH EXTRACTION Bilateral 2007   Medication List:  Current Outpatient Medications  Medication Sig Dispense Refill   amLODipine (NORVASC) 10 MG tablet Take 10 mg  by mouth daily.     FLUoxetine (PROZAC) 40 MG capsule Take 40 mg by mouth daily.     lamoTRIgine 200 MG TBDP Take 1 tablet by mouth daily.     metoprolol tartrate (LOPRESSOR) 25 MG tablet Take 25 mg by mouth 2 (two) times daily.     Multiple Vitamin (MULTI-VITAMIN) tablet Take 1 tablet by mouth daily.     olmesartan-hydrochlorothiazide (BENICAR HCT) 40-12.5 MG tablet Take 1 tablet by mouth daily.     triamcinolone cream (KENALOG) 0.1 % Apply topically 2 (two) times daily.     VYVANSE 50 MG capsule Take 50 mg by mouth every morning.  0   No current facility-administered medications for this visit.   Allergies: Allergies  Allergen Reactions   Lisinopril Swelling    Per patient   Social History: Social History   Socioeconomic History   Marital status: Legally Separated    Spouse name: Not on file   Number of children: Not on file   Years of education: Not on file   Highest education level: Not  on file  Occupational History   Not on file  Tobacco Use   Smoking status: Never   Smokeless tobacco: Never  Vaping Use   Vaping Use: Never used  Substance and Sexual Activity   Alcohol use: Yes    Comment: occasional   Drug use: No   Sexual activity: Yes    Birth control/protection: None  Other Topics Concern   Not on file  Social History Narrative   Not on file   Social Determinants of Health   Financial Resource Strain: Not on file  Food Insecurity: Not on file  Transportation Needs: Not on file  Physical Activity: Not on file  Stress: Not on file  Social Connections: Not on file   Lives in a 48 year old house. Smoking: denies  Environmental History: Water Damage/mildew in the house: no Carpet in the family room: no Carpet in the bedroom: no Heating: heat pump Cooling: heat pump Pet: no  Family History: Family History  Problem Relation Age of Onset   Mental illness Mother    Hypertension Mother    GER disease Mother    Asthma Father    Hypertension Father     Diabetes Father    Mental illness Maternal Aunt    Mental illness Maternal Uncle    Diabetes Maternal Grandmother    Review of Systems  Constitutional:  Negative for appetite change, chills, fever and unexpected weight change.  HENT:  Negative for congestion and rhinorrhea.   Eyes:  Negative for itching.  Respiratory:  Negative for cough, chest tightness, shortness of breath and wheezing.   Cardiovascular:  Negative for chest pain.  Gastrointestinal:  Negative for abdominal pain.  Genitourinary:  Negative for difficulty urinating.  Skin:  Positive for rash.  Neurological:  Negative for headaches.    Objective: BP 130/84   Pulse 95   Temp 98.9 F (37.2 C)   Resp 18   Ht '5\' 3"'$  (1.6 m)   Wt 223 lb 12 oz (101.5 kg)   SpO2 98%   BMI 39.64 kg/m  Body mass index is 39.64 kg/m. Physical Exam Vitals and nursing note reviewed.  Constitutional:      Appearance: Normal appearance. She is well-developed. She is obese.  HENT:     Head: Normocephalic and atraumatic.     Right Ear: Tympanic membrane and external ear normal.     Left Ear: Tympanic membrane and external ear normal.     Nose: Nose normal.     Mouth/Throat:     Mouth: Mucous membranes are moist.     Pharynx: Oropharynx is clear.  Eyes:     Conjunctiva/sclera: Conjunctivae normal.  Cardiovascular:     Rate and Rhythm: Normal rate and regular rhythm.     Heart sounds: Normal heart sounds. No murmur heard.    No friction rub. No gallop.  Pulmonary:     Effort: Pulmonary effort is normal.     Breath sounds: Normal breath sounds. No wheezing, rhonchi or rales.  Musculoskeletal:     Cervical back: Neck supple.  Skin:    General: Skin is warm and dry.     Findings: Rash present.     Comments: Scattered patches of hyperpigmented areas on lower extremities, abdominal area, scarring from gastric bypass noted.  Mobile, nontender nodule on left upper shin area.   Neurological:     Mental Status: She is alert and  oriented to person, place, and time.  Psychiatric:  Behavior: Behavior normal.    The plan was reviewed with the patient/family, and all questions/concerned were addressed.  It was my pleasure to see Daneille today and participate in her care. Please feel free to contact me with any questions or concerns.  Sincerely,  Rexene Alberts, DO Allergy & Immunology  [facility] office: [place] office: [phone]

## 2022-05-15 ENCOUNTER — Ambulatory Visit (INDEPENDENT_AMBULATORY_CARE_PROVIDER_SITE_OTHER): Payer: Medicaid Other | Admitting: Allergy

## 2022-05-15 ENCOUNTER — Encounter: Payer: Self-pay | Admitting: Allergy

## 2022-05-15 VITALS — BP 130/84 | HR 95 | Temp 98.9°F | Resp 18 | Ht 63.0 in | Wt 223.8 lb

## 2022-05-15 DIAGNOSIS — D1724 Benign lipomatous neoplasm of skin and subcutaneous tissue of left leg: Secondary | ICD-10-CM

## 2022-05-15 DIAGNOSIS — T783XXA Angioneurotic edema, initial encounter: Secondary | ICD-10-CM | POA: Insufficient documentation

## 2022-05-15 DIAGNOSIS — T783XXD Angioneurotic edema, subsequent encounter: Secondary | ICD-10-CM

## 2022-05-15 DIAGNOSIS — R21 Rash and other nonspecific skin eruption: Secondary | ICD-10-CM

## 2022-05-15 DIAGNOSIS — J3089 Other allergic rhinitis: Secondary | ICD-10-CM

## 2022-05-15 NOTE — Patient Instructions (Signed)
Rash Not sure if there's any allergic trigger to your rash. Allergic rashes are usually more itchy in nature.  Some areas look like eczema possibly. If this worsens - take pictures and will need referral to dermatology next. No indication for any allergy testing today.  See below for proper skin care measures. Use triamcinolone 0.1% ointment twice a day as needed for rash flares. Do not use on the face, neck, armpits or groin area. Do not use more than 3 weeks in a row.   Swelling Most likely due to your lisinopril. Continue to avoid all ace inhibitor type of medications. If you have another episodes then come see me as we may need to get some bloodwork then.  Left leg knot This feels like a lipoma to me. If it gets bigger - then let your PCP know.   Follow up if needed.   Skin care recommendations  Bath time: Always use lukewarm water. AVOID very hot or cold water. Keep bathing time to 5-10 minutes. Do NOT use bubble bath. Use a mild soap and use just enough to wash the dirty areas. Do NOT scrub skin vigorously.  After bathing, pat dry your skin with a towel. Do NOT rub or scrub the skin.  Moisturizers and prescriptions:  ALWAYS apply moisturizers immediately after bathing (within 3 minutes). This helps to lock-in moisture. Use the moisturizer several times a day over the whole body. Good summer moisturizers include: Aveeno, CeraVe, Cetaphil. Good winter moisturizers include: Aquaphor, Vaseline, Cerave, Cetaphil, Eucerin, Vanicream. When using moisturizers along with medications, the moisturizer should be applied about one hour after applying the medication to prevent diluting effect of the medication or moisturize around where you applied the medications. When not using medications, the moisturizer can be continued twice daily as maintenance.  Laundry and clothing: Avoid laundry products with added color or perfumes. Use unscented hypo-allergenic laundry products such as  Tide free, Cheer free & gentle, and All free and clear.  If the skin still seems dry or sensitive, you can try double-rinsing the clothes. Avoid tight or scratchy clothing such as wool. Do not use fabric softeners or dyer sheets.

## 2022-05-15 NOTE — Assessment & Plan Note (Signed)
Getting bigger the past 2 years, unremarkable x-ray apparently.  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  Follow up with PCP if it gets bigger.

## 2022-05-15 NOTE — Assessment & Plan Note (Addendum)
Rash x 4 months with no triggers. Improved since onset and not pruritic. Denies changes in diet, meds, personal care products. Nobody else has this rash at home. Triamcinolone somewhat helpful.  Unclear etiology - less likely to be allergic as it is not very pruritic.  Some areas look like eczema.  If this worsens - take pictures and will need referral to dermatology next. Symptoms improving now.  No indication for any allergy testing today. See below for proper skin care measures. Use triamcinolone 0.1% ointment twice a day as needed for rash flares. Do not use on the face, neck, armpits or groin area. Do not use more than 3 weeks in a row.

## 2022-05-15 NOTE — Assessment & Plan Note (Signed)
Lisinopril induced. No prior history of angioedema or family history of angioedema. Continue to avoid all ace inhibitor type of medications. If you have another episodes then will need to get bloodwork next.

## 2022-06-13 ENCOUNTER — Other Ambulatory Visit: Payer: Self-pay

## 2022-06-13 ENCOUNTER — Emergency Department (HOSPITAL_COMMUNITY)
Admission: EM | Admit: 2022-06-13 | Discharge: 2022-06-13 | Disposition: A | Discharge status: Home / Self Care | Payer: Medicaid Other | Attending: Emergency Medicine | Admitting: Emergency Medicine

## 2022-06-13 ENCOUNTER — Emergency Department (HOSPITAL_COMMUNITY): Payer: Medicaid Other

## 2022-06-13 ENCOUNTER — Encounter (HOSPITAL_COMMUNITY): Payer: Self-pay

## 2022-06-13 DIAGNOSIS — M79605 Pain in left leg: Secondary | ICD-10-CM | POA: Diagnosis present

## 2022-06-13 DIAGNOSIS — M5442 Lumbago with sciatica, left side: Secondary | ICD-10-CM | POA: Insufficient documentation

## 2022-06-13 LAB — PREGNANCY, URINE: Preg Test, Ur: NEGATIVE

## 2022-06-13 MED ORDER — KETOROLAC TROMETHAMINE 15 MG/ML IJ SOLN
15.0000 mg | Freq: Once | INTRAMUSCULAR | Status: AC
  Administered 2022-06-13: 15 mg via INTRAMUSCULAR
  Filled 2022-06-13: qty 1

## 2022-06-13 MED ORDER — METHOCARBAMOL 500 MG PO TABS: 500.0000 mg | ORAL_TABLET | Freq: Two times a day (BID) | ORAL | 0 refills | Status: AC

## 2022-06-13 MED ORDER — DEXAMETHASONE SODIUM PHOSPHATE 10 MG/ML IJ SOLN
10.0000 mg | Freq: Once | INTRAMUSCULAR | Status: AC
  Administered 2022-06-13: 10 mg via INTRAMUSCULAR
  Filled 2022-06-13: qty 1

## 2022-06-13 MED ORDER — LIDOCAINE 5 % EX PTCH
1.0000 | MEDICATED_PATCH | Freq: Once | CUTANEOUS | Status: DC
  Administered 2022-06-13: 1 via TRANSDERMAL
  Filled 2022-06-13: qty 1

## 2022-06-13 MED ORDER — METHOCARBAMOL 500 MG PO TABS
500.0000 mg | ORAL_TABLET | Freq: Once | ORAL | Status: AC
  Administered 2022-06-13: 500 mg via ORAL
  Filled 2022-06-13: qty 1

## 2022-06-13 MED ORDER — PREDNISONE 20 MG PO TABS: 40.0000 mg | ORAL_TABLET | Freq: Every day | ORAL | 0 refills | Status: AC

## 2022-06-13 MED ORDER — LIDOCAINE 5 % EX PTCH: 1.0000 | MEDICATED_PATCH | CUTANEOUS | 0 refills | Status: AC

## 2022-06-13 NOTE — ED Provider Notes (Signed)
[facility] Provider Note   CSN: [id number] Arrival date & time: 06/13/22  1256     History  Chief Complaint  Patient presents with   Leg Pain    Suzanne Andrade is a 47 y.o. female who presents to the ED with concerns for left leg pain onset 2 weeks. Notes that she was playing with her grandchildren prior to the onset of her symptoms. Tried OTC meds for her symptoms. Denies numbness, tingling, weakness, saddle parethesia, bowel/bladder incontinence.    The history is provided by the patient. No language interpreter was used.       Home Medications Prior to Admission medications   Medication Sig Start Date End Date Taking? Authorizing Provider  lidocaine (LIDODERM) 5 % Place 1 patch onto the skin daily. Remove & Discard patch within 12 hours or as directed by MD 06/13/22  Yes Lounell Schumacher A, PA-C  methocarbamol (ROBAXIN) 500 MG tablet Take 1 tablet (500 mg total) by mouth 2 (two) times daily. 06/13/22  Yes Inesha Sow A, PA-C  predniSONE (DELTASONE) 20 MG tablet Take 2 tablets (40 mg total) by mouth daily for 5 days. 06/13/22 06/18/22 Yes Damen Windsor A, PA-C  amLODipine (NORVASC) 10 MG tablet Take 10 mg by mouth daily.    [provider]  FLUoxetine (PROZAC) 40 MG capsule Take 40 mg by mouth daily. 03/21/21   [provider]  lamoTRIgine 200 MG TBDP Take 1 tablet by mouth daily. 06/28/21   [provider]  metoprolol tartrate (LOPRESSOR) 25 MG tablet Take 25 mg by mouth 2 (two) times daily. 11/15/20   [provider]  Multiple Vitamin (MULTI-VITAMIN) tablet Take 1 tablet by mouth daily.    [provider]  olmesartan-hydrochlorothiazide (BENICAR HCT) 40-12.5 MG tablet Take 1 tablet by mouth daily. 04/18/22   [provider]  triamcinolone cream (KENALOG) 0.1 % Apply topically 2 (two) times daily. 04/17/22   [provider]  VYVANSE 50 MG capsule Take 50 mg by mouth every morning.  11/23/17   [provider]      Allergies    Lisinopril    Review of Systems   Review of Systems  All other systems reviewed and are negative.   Physical Exam Updated Vital Signs BP 128/80   Pulse 72   Temp 98.6 F (37 C)   Resp 16   LMP 05/30/2022 (Approximate)   SpO2 99%  Physical Exam Vitals and nursing note reviewed.  Constitutional:      General: She is not in acute distress.    Appearance: Normal appearance.  Eyes:     General: No scleral icterus.    Extraocular Movements: Extraocular movements intact.  Cardiovascular:     Rate and Rhythm: Normal rate.  Pulmonary:     Effort: Pulmonary effort is normal. No respiratory distress.  Abdominal:     Palpations: Abdomen is soft. There is no mass.     Tenderness: There is no abdominal tenderness.  Musculoskeletal:        General: Normal range of motion.     Cervical back: Neck supple.     Comments: No spinal TTP. TTP noted to left lumbar musculature and left anterior hip. Able to flex and extend against resistance. Able to ambulate without assistance or difficulty.  Strength and sensation intact to bilateral lower extremities.  Skin:    General: Skin is warm and dry.     Findings: No rash.  Neurological:  Mental Status: She is alert.     Sensory: Sensation is intact.     Motor: Motor function is intact.  Psychiatric:        Behavior: Behavior normal.     ED Results / Procedures / Treatments   Labs (all labs ordered are listed, but only abnormal results are displayed) Labs Reviewed  PREGNANCY, URINE    EKG None  Radiology DG Hip Unilat W or Wo Pelvis 2-3 Views Left  Result Date: 06/13/2022 CLINICAL DATA:  Left hip pain. EXAM: DG HIP (WITH OR WITHOUT PELVIS) 2-3V LEFT COMPARISON:  None Available. FINDINGS: Hip joint space is symmetric. No degenerative changes in the hips. No acute osseous abnormality. Degenerative changes in the symphysis pubis and likely within the left sacroiliac joint.  IMPRESSION: Hips are normal in appearance. Electronically Signed   By: Lorin Picket M.D.   On: 06/13/2022 14:27    Procedures Procedures    Medications Ordered in ED Medications  lidocaine (LIDODERM) 5 % 1 patch (1 patch Transdermal Patch Applied 06/13/22 1406)  ketorolac (TORADOL) 15 MG/ML injection 15 mg (15 mg Intramuscular Given 06/13/22 1406)  methocarbamol (ROBAXIN) tablet 500 mg (500 mg Oral Given 06/13/22 1503)  dexamethasone (DECADRON) injection 10 mg (10 mg Intramuscular Given 06/13/22 1501)    ED Course/ Medical Decision Making/ A&P Clinical Course as of 06/13/22 2156  Wed Jun 13, 2022  1452 Discussed imaging with patient [SB]  1529 Symptoms improved with treatment regimen in the ED. Pt resting comfortably on stretcher.  [SB]  1945 Re-evaluated and noted continued symptom improvement with treatment regimen in the ED.  [SB]    Clinical Course User Index [SB] Filimon Miranda A, PA-C                             Medical Decision Making Amount and/or Complexity of Data Reviewed Labs: ordered. Radiology: ordered.  Risk Prescription drug management.   Patient with left lumbar back pain with radiation down the left posterior thigh.  No history of sciatica.  No focal neurological deficits noted on exam.  Able to ambulate without assistance or difficulty.  No loss of bowel or bladder control.  On exam patient with no spinal tenderness to palpation.  Tenderness to palpation noted to the left lumbar musculature and left anterior hip.  Able to flex and extend hip against resistance without difficulty.  Patient afebrile. Differential diagnosis includes but is not limited to fracture, herniation, muscle strain, sciatica, cauda equina.   Labs:  I ordered, and personally interpreted labs.  The pertinent results include:   Negative pregnancy urine  Imaging: I ordered imaging studies including left hip x-ray, CT lumbar spine I independently visualized and interpreted imaging which  showed: No acute findings on left hip x-ray.  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 I agree with the radiologist interpretation  Medications:  I ordered medication including toradol, norco, robaxin, lidoderm patch for pain management Reevaluation of the patient after these medicines and interventions, I reevaluated the patient and found that they have improved I have reviewed the patients home medicines and have made adjustments as needed   Disposition: Presentation suspicious for sciatica.  Doubt concerns at this time for cauda equina, fracture, herniation, dislocation. After consideration of the diagnostic results and the patients response to treatment, I feel that the patient would benefit from Discharge home.  Patient  instructed to follow-up with her neurosurgeon, Dr. Kathyrn Sheriff regarding today's ED visit.  Patient sent with a prescription for prednisone, Lidoderm patch, Robaxin discussed importance of follow-up with primary care provider for management.  Supportive care measures and strict return precautions discussed with patient at bedside. Pt acknowledges and verbalizes understanding. Pt appears safe for discharge. Follow up as indicated in discharge paperwork.    This chart was dictated using voice recognition software, Dragon. Despite the best efforts of this provider to proofread and correct errors, errors may still occur which can change documentation meaning.  Final Clinical Impression(s) / ED Diagnoses Final diagnoses:  Acute left-sided low back pain with left-sided sciatica    Rx / DC Orders ED Discharge Orders          Ordered    predniSONE (DELTASONE) 20 MG tablet  Daily        06/13/22 2028    lidocaine (LIDODERM) 5 %  Every 24 hours        06/13/22 2028    methocarbamol (ROBAXIN) 500 MG tablet  2 times daily        06/13/22 2028              Romell Cavanah,  Erza Mothershead A, PA-C 06/13/22 2156    Audley Hose, MD 06/13/22 2258

## 2022-06-13 NOTE — ED Triage Notes (Signed)
Patient has had left leg pain for a week. She said as the day carries on it hurts her more. Ambulatory in triage.

## 2022-06-13 NOTE — Discharge Instructions (Signed)
It was a pleasure taking care of you today!   Your CT scan showed concerns for stenosis however no emergent findings for fracture at this time.  You will be sent a prescription for Robaxin, Lidoderm patch, prednisone, take as directed. Ensure to change the Lidoderm patch every 12 hours and replace with a new one.  Do not operate any heavy machinery or drive while taking Robaxin as it can make you sleepy/drowsy.  You may apply ice or heat to the affected area for up to 15 minutes at a time.  Ensure to place a barrier between your skin and the ice or heat.  Call your neurosurgeon, Dr. Kathyrn Sheriff to set up a follow-up appointment regarding today's ED visit.  Return to the Emergency Department if you are experiencing increasing/worsening symptoms.

## 2023-05-22 ENCOUNTER — Other Ambulatory Visit: Payer: Self-pay

## 2023-05-22 ENCOUNTER — Encounter (HOSPITAL_BASED_OUTPATIENT_CLINIC_OR_DEPARTMENT_OTHER): Payer: Self-pay | Admitting: Emergency Medicine

## 2023-05-22 ENCOUNTER — Emergency Department (HOSPITAL_BASED_OUTPATIENT_CLINIC_OR_DEPARTMENT_OTHER): Payer: Medicaid Other

## 2023-05-22 ENCOUNTER — Emergency Department (HOSPITAL_BASED_OUTPATIENT_CLINIC_OR_DEPARTMENT_OTHER)
Admission: EM | Admit: 2023-05-22 | Discharge: 2023-05-22 | Disposition: A | Discharge status: Home / Self Care | Payer: Medicaid Other | Attending: Emergency Medicine | Admitting: Emergency Medicine

## 2023-05-22 DIAGNOSIS — R202 Paresthesia of skin: Secondary | ICD-10-CM | POA: Diagnosis not present

## 2023-05-22 DIAGNOSIS — I10 Essential (primary) hypertension: Secondary | ICD-10-CM | POA: Diagnosis not present

## 2023-05-22 DIAGNOSIS — R112 Nausea with vomiting, unspecified: Secondary | ICD-10-CM | POA: Diagnosis present

## 2023-05-22 DIAGNOSIS — R42 Dizziness and giddiness: Secondary | ICD-10-CM | POA: Insufficient documentation

## 2023-05-22 DIAGNOSIS — Z79899 Other long term (current) drug therapy: Secondary | ICD-10-CM | POA: Insufficient documentation

## 2023-05-22 DIAGNOSIS — R002 Palpitations: Secondary | ICD-10-CM | POA: Diagnosis not present

## 2023-05-22 DIAGNOSIS — Z20822 Contact with and (suspected) exposure to covid-19: Secondary | ICD-10-CM | POA: Insufficient documentation

## 2023-05-22 LAB — URINALYSIS, ROUTINE W REFLEX MICROSCOPIC
Bilirubin Urine: NEGATIVE
Glucose, UA: NEGATIVE mg/dL
Hgb urine dipstick: NEGATIVE
Ketones, ur: NEGATIVE mg/dL
Leukocytes,Ua: NEGATIVE
Nitrite: NEGATIVE
Protein, ur: NEGATIVE mg/dL
Specific Gravity, Urine: 1.01 (ref 1.005–1.030)
pH: 6 (ref 5.0–8.0)

## 2023-05-22 LAB — CBC
HCT: 34 % — ABNORMAL LOW (ref 36.0–46.0)
Hemoglobin: 11 g/dL — ABNORMAL LOW (ref 12.0–15.0)
MCH: 28.5 pg (ref 26.0–34.0)
MCHC: 32.4 g/dL (ref 30.0–36.0)
MCV: 88.1 fL (ref 80.0–100.0)
Platelets: 224 10*3/uL (ref 150–400)
RBC: 3.86 MIL/uL — ABNORMAL LOW (ref 3.87–5.11)
RDW: 16.5 % — ABNORMAL HIGH (ref 11.5–15.5)
WBC: 5.3 10*3/uL (ref 4.0–10.5)
nRBC: 0 % (ref 0.0–0.2)

## 2023-05-22 LAB — BASIC METABOLIC PANEL
Anion gap: 9 (ref 5–15)
BUN: 8 mg/dL (ref 6–20)
CO2: 26 mmol/L (ref 22–32)
Calcium: 8.6 mg/dL — ABNORMAL LOW (ref 8.9–10.3)
Chloride: 98 mmol/L (ref 98–111)
Creatinine, Ser: 0.6 mg/dL (ref 0.44–1.00)
GFR, Estimated: >60 mL/min (ref >60)
Glucose, Bld: 95 mg/dL (ref 70–99)
Potassium: 3.4 mmol/L — ABNORMAL LOW (ref 3.5–5.1)
Sodium: 133 mmol/L — ABNORMAL LOW (ref 135–145)

## 2023-05-22 LAB — TROPONIN I (HIGH SENSITIVITY)
Troponin I (High Sensitivity): 6 ng/L (ref <18)
Troponin I (High Sensitivity): 5 ng/L (ref <18)

## 2023-05-22 LAB — RESP PANEL BY RT-PCR (RSV, FLU A&B, COVID)  RVPGX2
Influenza A by PCR: NEGATIVE
Influenza B by PCR: NEGATIVE
Resp Syncytial Virus by PCR: NEGATIVE
SARS Coronavirus 2 by RT PCR: NEGATIVE

## 2023-05-22 LAB — HCG, SERUM, QUALITATIVE: Preg, Serum: NEGATIVE

## 2023-05-22 LAB — MAGNESIUM: Magnesium: 1.5 mg/dL — ABNORMAL LOW (ref 1.7–2.4)

## 2023-05-22 LAB — TSH: TSH: 0.598 u[IU]/mL (ref 0.350–4.500)

## 2023-05-22 MED ORDER — ONDANSETRON 4 MG PO TBDP: 4.0000 mg | ORAL_TABLET | Freq: Three times a day (TID) | ORAL | 0 refills | Status: AC | PRN

## 2023-05-22 MED ORDER — POTASSIUM CHLORIDE CRYS ER 20 MEQ PO TBCR
40.0000 meq | EXTENDED_RELEASE_TABLET | Freq: Once | ORAL | Status: AC
  Administered 2023-05-22: 40 meq via ORAL
  Filled 2023-05-22: qty 2

## 2023-05-22 MED ORDER — MECLIZINE HCL 25 MG PO TABS
25.0000 mg | ORAL_TABLET | Freq: Once | ORAL | Status: AC
  Administered 2023-05-22: 25 mg via ORAL
  Filled 2023-05-22: qty 1

## 2023-05-22 MED ORDER — SODIUM CHLORIDE 0.9 % IV BOLUS
500.0000 mL | Freq: Once | INTRAVENOUS | Status: AC
  Administered 2023-05-22: 500 mL via INTRAVENOUS

## 2023-05-22 MED ORDER — DROPERIDOL 2.5 MG/ML IJ SOLN
1.2500 mg | Freq: Once | INTRAMUSCULAR | Status: AC
  Administered 2023-05-22: 1.25 mg via INTRAVENOUS
  Filled 2023-05-22: qty 2

## 2023-05-22 MED ORDER — MAGNESIUM OXIDE -MG SUPPLEMENT 400 (240 MG) MG PO TABS
400.0000 mg | ORAL_TABLET | Freq: Once | ORAL | Status: AC
  Administered 2023-05-22: 400 mg via ORAL
  Filled 2023-05-22: qty 1

## 2023-05-22 MED ORDER — MAGNESIUM OXIDE 400 MG PO CAPS: 400.0000 mg | ORAL_CAPSULE | Freq: Every day | ORAL | 0 refills | Status: AC

## 2023-05-22 MED ORDER — MAGNESIUM SULFATE 2 GM/50ML IV SOLN
2.0000 g | Freq: Once | INTRAVENOUS | Status: AC
  Administered 2023-05-22: 2 g via INTRAVENOUS
  Filled 2023-05-22: qty 50

## 2023-05-22 MED ORDER — ONDANSETRON HCL 4 MG/2ML IJ SOLN
4.0000 mg | Freq: Once | INTRAMUSCULAR | Status: AC
  Administered 2023-05-22: 4 mg via INTRAVENOUS
  Filled 2023-05-22: qty 2

## 2023-05-22 NOTE — ED Notes (Signed)
 Patient transported to X-ray

## 2023-05-22 NOTE — ED Notes (Signed)
 Pt. Reports nausea is gone

## 2023-05-22 NOTE — ED Triage Notes (Signed)
 Dizziness , palpitation , tingling hand and feet . Feeling anxious , no meds . Denies chest pain . Hx HTN

## 2023-05-22 NOTE — ED Provider Notes (Signed)
 Care transferred to me.  Patient is feeling a lot better now states she wants to go home.  She was able to ambulate to the bathroom.  No recurrent symptoms.  Her magnesium  and her potassium are low and these will be repleted both here as well as outpatient (her potassium is only minimally low).  Otherwise, appears stable for discharge home with return precautions.   Freddi Hamilton, MD 05/22/23 [phone]

## 2023-05-22 NOTE — ED Notes (Signed)
 Stood Pt. To walk her and she started to dry heave and felt nauseated again.

## 2023-05-22 NOTE — ED Provider Notes (Signed)
 [facility] Provider Note   CSN: [id number] Arrival date & time: 05/22/23  9075     History  Chief Complaint  Patient presents with   Palpitations    Suzanne Andrade is a 48 y.o. female.  Pt is a 48 yo female with pmhx significant for htn and anxiety.  Pt woke up this am around 0300 and felt dizzy and lightheaded.  She ate some jello and had an episode of vomiting.  She is not sure if she threw up her meds.  Pt has some tingling at the bottom of her left foot.         Home Medications Prior to Admission medications   Medication Sig Start Date End Date Taking? Authorizing Provider  FLUoxetine (PROZAC) 20 MG capsule Take 20 mg by mouth daily. 04/19/23  Yes [provider]  lamoTRIgine 100 MG TBDP Take 1 tablet by mouth 2 (two) times daily. 01/01/23  Yes [provider]  metoprolol tartrate (LOPRESSOR) 25 MG tablet Take 25 mg by mouth 2 (two) times daily. 11/15/20  Yes [provider]  olmesartan-hydrochlorothiazide  (BENICAR HCT) 40-12.5 MG tablet Take 1 tablet by mouth daily. 04/18/22  Yes [provider]  ondansetron  (ZOFRAN -ODT) 4 MG disintegrating tablet Take 1 tablet (4 mg total) by mouth every 8 (eight) hours as needed. 05/22/23  Yes Rosi Secrist, MD  RETIN-A 0.025 % cream Apply 1 application  topically at bedtime. 04/15/23  Yes [provider]  VYVANSE 50 MG capsule Take 50 mg by mouth every morning. 11/23/17  Yes [provider]  lidocaine  (LIDODERM ) 5 % Place 1 patch onto the skin daily. Remove & Discard patch within 12 hours or as directed by MD Patient not taking: Reported on 05/22/2023 06/13/22   Blue, Soijett A, PA-C  methocarbamol  (ROBAXIN ) 500 MG tablet Take 1 tablet (500 mg total) by mouth 2 (two) times daily. Patient not taking: Reported on 05/22/2023 06/13/22   Blue, Soijett A, PA-C      Allergies    Lisinopril    Review of Systems   Review of Systems  Cardiovascular:   Positive for palpitations.  Neurological:  Positive for dizziness.       Tingling  All other systems reviewed and are negative.   Physical Exam Updated Vital Signs BP (!) 159/85   Pulse 77   Temp 98.1 F (36.7 C) (Oral)   Resp 20   Wt 102.1 kg   LMP 05/15/2023   SpO2 100%   BMI 39.86 kg/m  Physical Exam Vitals and nursing note reviewed.  Constitutional:      Appearance: Normal appearance. She is obese.  HENT:     Head: Normocephalic and atraumatic.     Right Ear: External ear normal.     Left Ear: External ear normal.     Nose: Nose normal.     Mouth/Throat:     Mouth: Mucous membranes are moist.     Pharynx: Oropharynx is clear.  Eyes:     Extraocular Movements: Extraocular movements intact.     Conjunctiva/sclera: Conjunctivae normal.     Pupils: Pupils are equal, round, and reactive to light.  Cardiovascular:     Rate and Rhythm: Normal rate and regular rhythm.     Pulses: Normal pulses.     Heart sounds: Normal heart sounds.  Pulmonary:     Effort: Pulmonary effort is normal.     Breath sounds: Normal breath sounds.  Abdominal:  General: Abdomen is flat. Bowel sounds are normal.     Palpations: Abdomen is soft.  Musculoskeletal:        General: Normal range of motion.     Cervical back: Normal range of motion and neck supple.  Skin:    General: Skin is warm.     Capillary Refill: Capillary refill takes less than 2 seconds.  Neurological:     General: No focal deficit present.     Mental Status: She is alert and oriented to person, place, and time.  Psychiatric:        Mood and Affect: Mood normal.        Behavior: Behavior normal.        Thought Content: Thought content normal.        Judgment: Judgment normal.     ED Results / Procedures / Treatments   Labs (all labs ordered are listed, but only abnormal results are displayed) Labs Reviewed  BASIC METABOLIC PANEL - Abnormal; Notable for the following components:      Result Value   Sodium 133  (*)    Potassium 3.4 (*)    Calcium 8.6 (*)    All other components within normal limits  CBC - Abnormal; Notable for the following components:   RBC 3.86 (*)    Hemoglobin 11.0 (*)    HCT 34.0 (*)    RDW 16.5 (*)    All other components within normal limits  MAGNESIUM  - Abnormal; Notable for the following components:   Magnesium  1.5 (*)    All other components within normal limits  RESP PANEL BY RT-PCR (RSV, FLU A&B, COVID)  RVPGX2  HCG, SERUM, QUALITATIVE  URINALYSIS, ROUTINE W REFLEX MICROSCOPIC  TSH  TROPONIN I (HIGH SENSITIVITY)  TROPONIN I (HIGH SENSITIVITY)    EKG EKG Interpretation Date/Time:  Wednesday May 22 2023 09:33:56 EST Ventricular Rate:  74 PR Interval:  163 QRS Duration:  85 QT Interval:  419 QTC Calculation: 465 R Axis:   76  Text Interpretation: Sinus rhythm No significant change since last tracing Confirmed by Dean Clarity [phone]) on 05/22/2023 9:44:05 AM  Radiology DG Chest 2 View Result Date: 05/22/2023 CLINICAL DATA:  48 year old female with palpitations, dizziness, vomiting. EXAM: CHEST - 2 VIEW COMPARISON:  CT Chest, Abdomen, and Pelvis 10/26/2020. FINDINGS: PA and lateral views 1029 hours. Lung volumes and mediastinal contours remain normal. Visualized tracheal air column is within normal limits. No pneumothorax, pulmonary edema, pleural effusion or consolidation. Lower lobe lung markings appear symmetric, within normal limits. Chronic epigastric surgical clips. Negative visible bowel gas. No acute osseous abnormality identified. IMPRESSION: No acute cardiopulmonary abnormality. Electronically Signed   By: VEAR Hurst M.D.   On: 05/22/2023 10:56    Procedures Procedures    Medications Ordered in ED Medications  sodium chloride  0.9 % bolus 500 mL (0 mLs Intravenous Stopped 05/22/23 1215)  meclizine  (ANTIVERT ) tablet 25 mg (25 mg Oral Given 05/22/23 1011)  magnesium  sulfate IVPB 2 g 50 mL (0 g Intravenous Stopped 05/22/23 1259)  ondansetron  (ZOFRAN )  injection 4 mg (4 mg Intravenous Given 05/22/23 1327)  droperidol  (INAPSINE ) 2.5 MG/ML injection 1.25 mg (1.25 mg Intravenous Given 05/22/23 1446)    ED Course/ Medical Decision Making/ A&P                                 Medical Decision Making Amount and/or Complexity of Data Reviewed Labs: ordered. Radiology: ordered.  ECG/medicine tests: ordered.  Risk Prescription drug management.   This patient presents to the ED for concern of palpitations, this involves an extensive number of treatment options, and is a complaint that carries with it a high risk of complications and morbidity.  The differential diagnosis includes cardiac, pulm, gi, psych   Co morbidities that complicate the patient evaluation  Htn and anxiety   Additional history obtained:  Additional history obtained from epic chart review External records from outside source obtained and reviewed including husband   Lab Tests:  I Ordered, and personally interpreted labs.  The pertinent results include:  mg low at 1.5, covid/flu/rsv neg, tsh nl, trop nl, preg neg, ua neg, cbc with chronic anemia (hgb 11)   Imaging Studies ordered:  I ordered imaging studies including cxr  I independently visualized and interpreted imaging which showed No acute cardiopulmonary abnormality.  I agree with the radiologist interpretation   Cardiac Monitoring:  The patient was maintained on a cardiac monitor.  I personally viewed and interpreted the cardiac monitored which showed an underlying rhythm of: nsr   Medicines ordered and prescription drug management:  I ordered medication including ivfs/meclizine   for sx  Reevaluation of the patient after these medicines showed that the patient improved I have reviewed the patients home medicines and have made adjustments as needed   Test Considered:  ct   Critical Interventions:  Ivfs/nausea meds  Problem List / ED Course:  Nausea:  pt has improved, but became nauseous  again with ambulation.  Additional antiemetics given (inapsine ).  Pt signed out at shift change. Hypomagnesemia:  mg given   Reevaluation:  After the interventions noted above, I reevaluated the patient and found that they have :improved   Social Determinants of Health:  Lives at home   Dispostion:  Pending at shift change        Final Clinical Impression(s) / ED Diagnoses Final diagnoses:  Palpitations  Nausea and vomiting, unspecified vomiting type  Hypomagnesemia  Vertigo    Rx / DC Orders ED Discharge Orders          Ordered    ondansetron  (ZOFRAN -ODT) 4 MG disintegrating tablet  Every 8 hours PRN        05/22/23 1414              Dean Clarity, MD 05/22/23 1510

## 2023-05-22 NOTE — ED Notes (Signed)
 Pt. Reports she is feeling better and wants to go home.  Pt. States she has no nausea and no headache.

## 2023-05-22 NOTE — Discharge Instructions (Addendum)
 Your magnesium  and potassium were a little low, and so we are prescribing you magnesium  and you are already giving some potassium in the emergency department.  We are also prescribing a nausea medicine to use if you develop recurrent nausea.  Otherwise follow-up with your primary care physician.  If you develop new or worsening or uncontrolled vomiting, pain, fever, or any other new/concerning symptoms then return to the ER or call 911.

## 2023-05-30 ENCOUNTER — Other Ambulatory Visit: Payer: Self-pay | Admitting: Family Medicine

## 2023-05-30 ENCOUNTER — Ambulatory Visit
Admission: RE | Admit: 2023-05-30 | Discharge: 2023-05-30 | Disposition: A | Discharge status: Home / Self Care | Payer: Medicaid Other | Source: Ambulatory Visit | Attending: Family Medicine | Admitting: Family Medicine

## 2023-05-30 DIAGNOSIS — M79672 Pain in left foot: Secondary | ICD-10-CM
